# Patient Record
Sex: Female | Born: 1955 | Race: White | Hispanic: No | Smoking: Former smoker
Health system: Southern US, Community
[De-identification: ages and names within clinical notes are randomized; demographics above are authoritative.]

## PROBLEM LIST (undated history)

## (undated) DIAGNOSIS — I70203 Unspecified atherosclerosis of native arteries of extremities, bilateral legs: Secondary | ICD-10-CM

## (undated) DIAGNOSIS — I714 Abdominal aortic aneurysm, without rupture, unspecified: Secondary | ICD-10-CM

## (undated) DIAGNOSIS — E785 Hyperlipidemia, unspecified: Secondary | ICD-10-CM

## (undated) DIAGNOSIS — M79606 Pain in leg, unspecified: Secondary | ICD-10-CM

## (undated) DIAGNOSIS — G4762 Sleep related leg cramps: Secondary | ICD-10-CM

## (undated) HISTORY — DX: Unspecified atherosclerosis of native arteries of extremities, bilateral legs: I70.203

## (undated) HISTORY — DX: Pain in leg, unspecified: M79.606

## (undated) HISTORY — DX: Sleep related leg cramps: G47.62

## (undated) HISTORY — PX: TONSILLECTOMY: SUR1361

## (undated) HISTORY — DX: Hyperlipidemia, unspecified: E78.5

---

## 1984-09-29 HISTORY — PX: TUBAL LIGATION: SHX77

## 2000-04-26 ENCOUNTER — Encounter: Payer: Self-pay | Admitting: Emergency Medicine

## 2000-04-26 ENCOUNTER — Emergency Department (HOSPITAL_COMMUNITY): Admission: EM | Admit: 2000-04-26 | Discharge: 2000-04-26 | Payer: Self-pay | Admitting: Emergency Medicine

## 2003-10-28 ENCOUNTER — Emergency Department (HOSPITAL_COMMUNITY): Admission: EM | Admit: 2003-10-28 | Discharge: 2003-10-28 | Payer: Self-pay | Admitting: Emergency Medicine

## 2003-11-08 ENCOUNTER — Encounter: Admission: RE | Admit: 2003-11-08 | Discharge: 2004-01-01 | Payer: Self-pay | Admitting: Family Medicine

## 2004-01-04 ENCOUNTER — Encounter: Admission: RE | Admit: 2004-01-04 | Discharge: 2004-01-04 | Payer: Self-pay | Admitting: Family Medicine

## 2005-11-28 IMAGING — NM NM BONE 3 PHASE
10 series · 10 of 10 positions shown · non-contrast
Comparison: none

CLINICAL DATA: Right foot pain, especially second metatarsal.
 NUCLEAR MEDICINE THREE PHASE BONE SCAN ? 01/04/04 
 Following intravenous injection of 26.1 mCi of 5echnetium-FFm MDP, triple phase bone scan of the bilateral feet and ankles is compared with [REDACTED] right foot radiographs of 01/01/04.  On all three phases, no abnormality is seen, specifically at the second metatarsal.
 IMPRESSION
 Negative.

[bf bone flow · 2.36mm/px · 1 of 1 slices shown (1 of 5)]
[im 1/1]
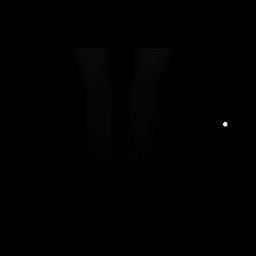

[bf bone flow · 2.33mm/px · 1 of 1 slices shown (2 of 5)]
[im 1/1]
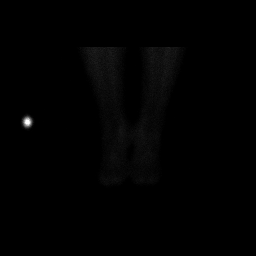

[st statics, dual detec · 2.36mm/px · 1 of 1 slices shown (1 of 5)]
[im 1/1]
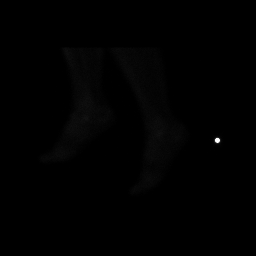

[st statics, dual detec · 2.33mm/px · 1 of 1 slices shown (2 of 5)]
[im 1/1]
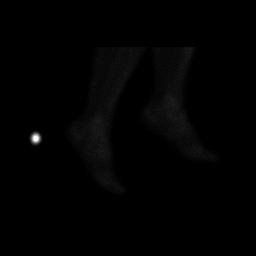

[st statics, dual detec · 2.36mm/px · 1 of 1 slices shown (3 of 5)]
[im 1/1]
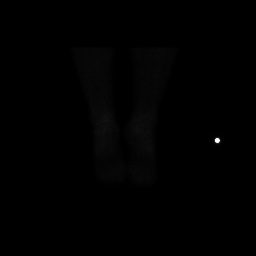

[st statics, dual detec · 2.33mm/px · 1 of 1 slices shown (4 of 5)]
[im 1/1]
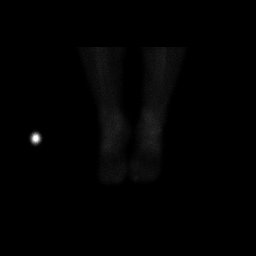

[bf bone flow · 2.33mm/px · 1 of 1 slices shown (3 of 5)]
[im 1/1]
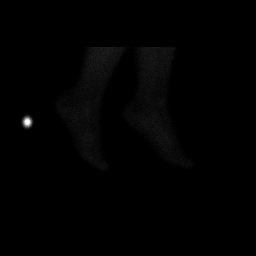

[bf bone flow · 2.36mm/px · 1 of 1 slices shown (4 of 5)]
[im 1/1]
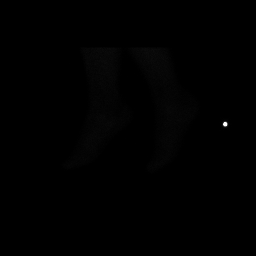

[bf bone flow · 2.36mm/px · 1 of 1 slices shown (5 of 5)]
[im 1/1]
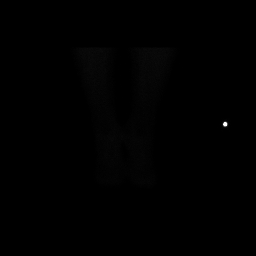

[st statics, dual detec · 2.36mm/px · 1 of 1 slices shown (5 of 5)]
[im 1/1]
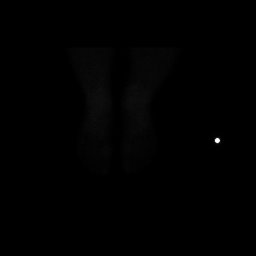

[10 of 10 positions shown; findings below may reference images not displayed]

## 2012-01-16 ENCOUNTER — Other Ambulatory Visit (HOSPITAL_COMMUNITY)
Admission: RE | Admit: 2012-01-16 | Discharge: 2012-01-16 | Disposition: A | Discharge status: Home / Self Care | Payer: BC Managed Care – PPO | Source: Ambulatory Visit | Attending: Family Medicine | Admitting: Family Medicine

## 2012-01-16 DIAGNOSIS — Z1159 Encounter for screening for other viral diseases: Secondary | ICD-10-CM | POA: Insufficient documentation

## 2012-01-16 DIAGNOSIS — Z0000 Encounter for general adult medical examination without abnormal findings: Secondary | ICD-10-CM | POA: Insufficient documentation

## 2012-02-17 ENCOUNTER — Other Ambulatory Visit: Payer: Self-pay | Admitting: Cardiology

## 2012-02-19 NOTE — H&P (Signed)
Patient: Jane Miller, Jane Miller Provider: Dade Rodin, MD  DOB: 09/14/1956 Age: 55 Y Sex: Female Date: 02/17/2012  Phone: [phone]   Address: [address], [place], [place]-[address]  Pcp: WILLIAM R HARRIS, IV        Subjective:     CC:    1. MS/stress test f/u.        HPI:  General:  55-year-old with long-standing tobacco history, hyperlipidemia with recent chest discomfort. She's been having this over the past 2 months and this has been happening with more frequency almost daily currently. 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 No nausea or diaphoresis. Stress was situations can also bring them on. Occasionally the chest pain can last for hours in duration. Her LDL is 159, HDL is 29. She also has occasional burning in her legs when walking. At times, she feels as though a grocery cart is hitting her Achilles tendon. She underwent a nuclear stress test which did show a subtle change in the anterior wall, ischemia cannot be excluded however breast attenuation was present. After lengthy discussion today with her daughter as well and since she continues to have her chest discomfort almost on a daily basis, we will proceed with heart catheterization..        ROS:  The other elements of the review of systems are negative.       Family History: Father: deceased 79 yrs gangrene in toe, then MRSA Mother: deceased 69 yrs heart attack at 60 and 69 Paternal Grand Father: deceased Paternal Grand Mother: deceased breast cancer Maternal Grand Father: deceased Maternal Grand Mother: deceased Sister 1: alive obesity, GERD 1 sister(s) .        Social History:  General:  History of smoking  cigarettes: Current smoker Frequency: 1 PPD Estimated Pack-years: 39 Smoking: 1 PPD.  Alcohol: Rare.  Marital Status: Divorced.  Children: 2, 2 grandkids.  Religion: Baptist.  Seat belt use: yes.        Medications: Aspirin  81 MG Tablet Chewable 1 tablet Once a day, Medication List reviewed and reconciled with the patient       Allergies: N.K.D.A.      Objective:     Vitals: Wt 170, Wt change -2 lb, Ht 59, BMI 34.33, Pulse sitting 72, BP sitting 122/66.       Examination:  Cardiology, General:  GENERAL APPEARANCE: pleasant, NAD.  HEENT: unremarkable.  CAROTID UPSTROKE: normal, no bruit.  JVD: flat.  HEART SOUNDS: regular, normal S1, S2, no S3 or S4.  MURMUR: absent.  LUNGS: no rales or wheezes.  ABDOMEN: soft, non tender, positive bowel sounds, no masses felt.  EXTREMITIES: no leg edema.  PERIPHERAL PULSES: diminished, 2+ radial.        Assessment:     Assessment:  1. 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 Claudication - 443.9    Plan:     1. Abnormal cardiovascular stress test  LAB: Basic Metabolic    GLUCOSE 82  70-99 - mg/dL    BUN 9  6-26 - mg/dL    CREATININE 0.81  0.60-1.30 - mg/dl    eGFR (NON-AFRICAN AMERICAN) 73 >60 - calc    eGFR (AFRICAN AMERICAN) 88 >60 - calc    SODIUM 142  136-145 - mmol/L    POTASSIUM 4.2  3.5-5.5 - mmol/L    CHLORIDE 108  98-107 - mmol/L H     C02 30  22-32 - mg/dL    ANION GAP 8.1 6.0-20.0 - mmol/L    CALCIUM 9.2  8.6-10.3 - mg/dL     Antoinette Borgwardt 02/18/2012 05:06:09 PM > okay for catheterization    LAB: CBC with Diff    WBC 8.4 4.0-11.0 - K/ul    RBC 4.57 4.20-5.40 - M/uL    HGB 14.0 12.0-16.0 - g/dL    HCT 42.0 37.0-47.0 - %    MCH 30.6 27.0-33.0 - pg    MPV 8.1 7.5-10.7 - fL    MCV 91.9 81.0-99.0 - fL    MCHC 33.3 32.0-36.0 - g/dL    RDW 13.3 11.5-15.5 - %    NRBC# 0.00 -    PLT 245 150-400 - K/uL    NEUT % 69.2 43.3-71.9 - %    NRBC% 0.00 - %    LYMPH% 18.5 16.8-43.5 - %    MONO % 8.7 4.6-12.4 - %    EOS % 2.5 0.0-7.8 - %    BASO % 1.1 0.0-1.0 - % H   NEUT # 5.8 1.9-7.2 - K/uL    LYMPH# 1.50 1.10-2.70 - K/uL    MONO # 0.7 0.3-0.8 - K/uL    EOS # 0.2 0.0-0.6 - K/uL    BASO  # 0.1 0.0-0.1 - K/uL     Breyonna Nault 02/18/2012 05:06:09 PM > okay for catheterization    LAB: PT (Prothrombin Time) (005199)    Prothrombin Time 10.9 9.1-12.0 - SEC    INR 1.0 0.8-1.2 -     Keiaira Donlan 02/18/2012 05:06:09 PM > okay for catheterization    Given her abnormality on the nuclear stress test as well as ongoing symptoms, tobacco use, age, hyperlipidemia we will proceed with cardiac catheterization. Radial artery approach. Risks and benefits of cardiac catheterization have been reviewed including risk of stroke, heart attack, death, bleeding, renal impariment and arterial damage. There was ample oppurtuny to answer questions. Alternatives were discussed. Patient understands and wishes to proceed. If symptoms worsen or become more worrisome, she is to seek medical attention. Continue with aspirin.       2. Combined hyperlipidemia Start Atorvastatin Calcium Tablet, 20 MG, 1 tablet, Orally, Once a day, 30 day(s), 30, Refills 12 .  LAB: Statin Panel (Ordered for 05/19/2012) I would like to see her LDL less than 100. I will start atorvastatin.She states that she has trouble with some medications with nausea. We will monitor.       3. Claudication  I will proceed with workup after cardiac catheterization. This will include ABIs.        Immunizations:        Labs:        Procedure Codes: 80048 ECL BMP, 85025 ECL CBC PLATELET DIFF, 36415 BLOOD COLLECTION ROUTINE VENIPUNCTURE       Preventive:         Follow Up: post cath      Provider: Hosanna Betley, MD  Patient: Jane Miller, Jane Miller DOB: 04/18/1956 Date: 02/17/2012    

## 2012-02-20 ENCOUNTER — Ambulatory Visit (HOSPITAL_COMMUNITY)
Admission: RE | Admit: 2012-02-20 | Discharge: 2012-02-20 | Disposition: A | Discharge status: Home / Self Care | Payer: BC Managed Care – PPO | Source: Ambulatory Visit | Attending: Cardiology | Admitting: Cardiology

## 2012-02-20 ENCOUNTER — Encounter (HOSPITAL_COMMUNITY)
Admission: RE | Disposition: A | Discharge status: Home / Self Care | Payer: Self-pay | Source: Ambulatory Visit | Attending: Cardiology

## 2012-02-20 DIAGNOSIS — F17200 Nicotine dependence, unspecified, uncomplicated: Secondary | ICD-10-CM | POA: Insufficient documentation

## 2012-02-20 DIAGNOSIS — I739 Peripheral vascular disease, unspecified: Secondary | ICD-10-CM | POA: Insufficient documentation

## 2012-02-20 DIAGNOSIS — R0989 Other specified symptoms and signs involving the circulatory and respiratory systems: Secondary | ICD-10-CM | POA: Insufficient documentation

## 2012-02-20 DIAGNOSIS — Q159 Congenital malformation of eye, unspecified: Secondary | ICD-10-CM | POA: Insufficient documentation

## 2012-02-20 DIAGNOSIS — R0789 Other chest pain: Secondary | ICD-10-CM | POA: Insufficient documentation

## 2012-02-20 DIAGNOSIS — E785 Hyperlipidemia, unspecified: Secondary | ICD-10-CM | POA: Insufficient documentation

## 2012-02-20 DIAGNOSIS — R0609 Other forms of dyspnea: Secondary | ICD-10-CM | POA: Insufficient documentation

## 2012-02-20 HISTORY — PX: LEFT HEART CATHETERIZATION WITH CORONARY ANGIOGRAM: SHX5451

## 2012-02-20 SURGERY — LEFT HEART CATHETERIZATION WITH CORONARY ANGIOGRAM
Anesthesia: LOCAL

## 2012-02-20 MED ORDER — ASPIRIN 81 MG PO CHEW
CHEWABLE_TABLET | ORAL | Status: AC
  Administered 2012-02-20: 324 mg
  Filled 2012-02-20: qty 4

## 2012-02-20 MED ORDER — SODIUM CHLORIDE 0.9 % IV SOLN: 250.0000 mL | INTRAVENOUS | Status: DC | PRN

## 2012-02-20 MED ORDER — DIAZEPAM 5 MG PO TABS
ORAL_TABLET | ORAL | Status: AC
  Filled 2012-02-20: qty 1

## 2012-02-20 MED ORDER — MIDAZOLAM HCL 2 MG/2ML IJ SOLN
INTRAMUSCULAR | Status: AC
  Filled 2012-02-20: qty 2

## 2012-02-20 MED ORDER — SODIUM CHLORIDE 0.9 % IV SOLN: INTRAVENOUS | Status: DC

## 2012-02-20 MED ORDER — HEPARIN (PORCINE) IN NACL 2-0.9 UNIT/ML-% IJ SOLN
INTRAMUSCULAR | Status: AC
  Filled 2012-02-20: qty 2000

## 2012-02-20 MED ORDER — LIDOCAINE HCL (PF) 1 % IJ SOLN
INTRAMUSCULAR | Status: AC
  Filled 2012-02-20: qty 30

## 2012-02-20 MED ORDER — SODIUM CHLORIDE 0.9 % IJ SOLN: 3.0000 mL | Freq: Two times a day (BID) | INTRAMUSCULAR | Status: DC

## 2012-02-20 MED ORDER — ASPIRIN 81 MG PO CHEW: 324.0000 mg | CHEWABLE_TABLET | ORAL | Status: DC

## 2012-02-20 MED ORDER — SODIUM CHLORIDE 0.9 % IJ SOLN: 3.0000 mL | INTRAMUSCULAR | Status: DC | PRN

## 2012-02-20 MED ORDER — FENTANYL CITRATE 0.05 MG/ML IJ SOLN
INTRAMUSCULAR | Status: AC
  Filled 2012-02-20: qty 2

## 2012-02-20 MED ORDER — NITROGLYCERIN 0.2 MG/ML ON CALL CATH LAB
INTRAVENOUS | Status: AC
  Filled 2012-02-20: qty 1

## 2012-02-20 MED ORDER — DIAZEPAM 5 MG PO TABS: 5.0000 mg | ORAL_TABLET | ORAL | Status: DC

## 2012-02-20 NOTE — CV Procedure (Signed)
PROCEDURE:  Left heart catheterization with selective coronary angiography, left ventriculogram via the radial artery approach.  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  The risks, benefits, and details of the procedure were explained to the patient, including possibilities of stroke, heart attack, death, renal impairment, arterial damage, bleeding.  The patient verbalized understanding and wanted to proceed.  Informed written consent was obtained.  PROCEDURE TECHNIQUE:  Allen's test was performed pre-and post procedure and was normal. The right radial artery site was prepped and draped in a sterile fashion. One percent lidocaine was used for local anesthesia. Using the modified Seldinger technique a 5 French hydrophilic sheath was inserted into the radial artery without difficulty. 3 mg of verapamil was administered via the sheath. A Judkins right #4 catheter with the guidance of a Versicore wire was placed in the right coronary cusp and selectively cannulated the right coronary artery. After traversing the aortic arch, 3800 units of heparin IV was administered. A Judkins left #3.5 catheter was used to selectively cannulate the left main artery. Multiple views with hand injection of Omnipaque were obtained. Catheter a pigtail catheter was used to cross into the left ventricle, hemodynamics were obtained, and a left ventriculogram was performed in the RAO position with power injection. Following the procedure, sheath was removed, patient was hemodynamically stable, hemostasis was maintained with a Terumo T band.   CONTRAST:  Total of 50 ml.    FLOUROSCOPY TIME: 1.9 min.  COMPLICATIONS:  None.    HEMODYNAMICS:  Aortic pressure was 120/85mmHg; LV systolic pressure was ; LVEDP .  There was no gradient between the left  ventricle and aorta.    ANGIOGRAPHIC DATA:    Left main: No angiographically significant CAD, branches into LAD and circumflex  Left anterior descending (LAD): No angiographically significant CAD  Circumflex artery (CIRC): No angiographically significant CAD  Right coronary artery (RCA): No angiographically significant CAD  LEFT VENTRICULOGRAM:  Left ventricular angiogram was done in the 30 RAO projection and revealed normal left ventricular wall motion and systolic function with an estimated ejection fraction of 60%. Descending aorta and thoracic aorta appear normal  IMPRESSIONS:  No angiographically significant CAD Normal left ventricular systolic function.  LVEDP 16 mmHg.  Ejection fraction 60%.  RECOMMENDATION:  Chest discomfort noncardiac, likely musculoskeletal do to physical nature of her job. Tobacco cessation discussed. I will have her come back in one week for post catheterization check.

## 2012-02-20 NOTE — H&P (View-Only) (Signed)
Patient: Jane Miller, Jane Miller Provider: Donato Schultz, MD  DOB: 1956/04/21 Age: 56 Y Sex: Female Date: 02/17/2012  Phone: [phone]   Address: [address], [place], [place]  Pcp: Tawni Pummel HARRIS, IV        Subjective:     CC:    1. MS/stress test f/u.        HPI:  General:  56 year old with long-standing tobacco history, hyperlipidemia with recent chest discomfort. She's been having this over the past 2 months and this has been happening with more frequency almost daily currently. She works at the [employer] in [employer] and she notices left-sided chest pain/arm pain when exerting herself or lifting. No nausea or diaphoresis. Stress was situations can also bring them on. Occasionally the chest pain can last for hours in duration. Her LDL is 159, HDL is 29. She also has occasional burning in her legs when walking. At times, she feels as though a grocery cart is hitting her Achilles tendon. She underwent a nuclear stress test which did show a subtle change in the anterior wall, ischemia cannot be excluded however breast attenuation was present. After lengthy discussion today with her daughter as well and since she continues to have her chest discomfort almost on a daily basis, we will proceed with heart catheterization..        ROS:  The other elements of the review of systems are negative.       Family History: Father: deceased 51 yrs gangrene in toe, then MRSA Mother: deceased 95 yrs heart attack at 81 and 47 Paternal Grand Father: deceased Paternal Grand Mother: deceased breast cancer Maternal Grand Father: deceased Maternal Grand Mother: deceased Sister 1: alive obesity, GERD 1 sister(s) .        Social History:  General:  History of smoking  cigarettes: Current smoker Frequency: 1 PPD Estimated Pack-years: 39 Smoking: 1 PPD.  Alcohol: Rare.  Marital Status: Divorced.  Children: 2, 2 grandkids.  Religion: Baptist.  Seat belt use: yes.        Medications: Aspirin  81 MG Tablet Chewable 1 tablet Once a day, Medication List reviewed and reconciled with the patient       Allergies: N.K.D.A.      Objective:     Vitals: Wt 170, Wt change -2 lb, Ht 59, BMI 34.33, Pulse sitting 72, BP sitting 122/66.       Examination:  Cardiology, General:  GENERAL APPEARANCE: pleasant, NAD.  HEENT: unremarkable.  CAROTID UPSTROKE: normal, no bruit.  JVD: flat.  HEART SOUNDS: regular, normal S1, S2, no S3 or S4.  MURMUR: absent.  LUNGS: no rales or wheezes.  ABDOMEN: soft, non tender, positive bowel sounds, no masses felt.  EXTREMITIES: no leg edema.  PERIPHERAL PULSES: diminished, 2+ radial.        Assessment:     Assessment:  1. 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 Claudication - 443.9    Plan:     1. Abnormal cardiovascular stress test  LAB: Basic Metabolic    GLUCOSE 82  70-99 - mg/dL    BUN 9  6-29 - mg/dL    CREATININE 5.28  4.13-2.44 - mg/dl    eGFR (NON-AFRICAN AMERICAN) 73 >60 - calc    eGFR (AFRICAN AMERICAN) 88 >60 - calc    SODIUM 142  136-145 - mmol/L    POTASSIUM 4.2  3.5-5.5 - mmol/L    CHLORIDE 108  98-107 - mmol/L H  C02 30  22-32 - mg/dL    ANION GAP 8.1 1.6-10.9 - mmol/L    CALCIUM 9.2  8.6-10.3 - mg/dL     [facility] 60/45/4098 05:06:09 PM > okay for catheterization    LAB: CBC with Diff    WBC 8.4 4.0-11.0 - K/ul    RBC 4.57 4.20-5.40 - M/uL    HGB 14.0 12.0-16.0 - g/dL    HCT 11.9 14.7-82.9 - %    MCH 30.6 27.0-33.0 - pg    MPV 8.1 7.5-10.7 - fL    MCV 91.9 81.0-99.0 - fL    MCHC 33.3 32.0-36.0 - g/dL    RDW 56.2 13.0-86.5 - %    NRBC# 0.00 -    PLT 245 150-400 - K/uL    NEUT % 69.2 43.3-71.9 - %    NRBC% 0.00 - %    LYMPH% 18.5 16.8-43.5 - %    MONO % 8.7 4.6-12.4 - %    EOS % 2.5 0.0-7.8 - %    BASO % 1.1 0.0-1.0 - % H   NEUT # 5.8 1.9-7.2 - K/uL    LYMPH# 1.50 1.10-2.70 - K/uL    MONO # 0.7 0.3-0.8 - K/uL    EOS # 0.2 0.0-0.6 - K/uL    BASO  # 0.1 0.0-0.1 - K/uL     Torben Soloway 02/18/2012 05:06:09 PM > okay for catheterization    LAB: PT (Prothrombin Time) (784696)    Prothrombin Time 10.9 9.1-12.0 - SEC    INR 1.0 0.8-1.2 -     Kennedie Pardoe 02/18/2012 05:06:09 PM > okay for catheterization    Given her abnormality on the nuclear stress test as well as ongoing symptoms, tobacco use, age, hyperlipidemia we will proceed with cardiac catheterization. Radial artery approach. Risks and benefits of cardiac catheterization have been reviewed including risk of stroke, heart attack, death, bleeding, renal impariment and arterial damage. There was ample oppurtuny to answer questions. Alternatives were discussed. Patient understands and wishes to proceed. If symptoms worsen or become more worrisome, she is to seek medical attention. Continue with aspirin.       2. Combined hyperlipidemia Start Atorvastatin Calcium Tablet, 20 MG, 1 tablet, Orally, Once a day, 30 day(s), 30, Refills 12 .  LAB: Statin Panel (Ordered for 05/19/2012) I would like to see her LDL less than 100. I will start atorvastatin.She states that she has trouble with some medications with nausea. We will monitor.       3. Claudication  I will proceed with workup after cardiac catheterization. This will include ABIs.        Immunizations:        Labs:        Procedure Codes: 29528 ECL BMP, 85025 ECL CBC PLATELET DIFF, 41324 BLOOD COLLECTION ROUTINE VENIPUNCTURE       Preventive:         Follow Up: post cath      Provider: Donato Schultz, MD  Patient: Jane, Miller DOB: 03/30/1956 Date: 02/17/2012

## 2012-02-20 NOTE — Discharge Instructions (Signed)
Radial Site Care Refer to this sheet in the next few weeks. These instructions provide you with information on caring for yourself after your procedure. Your caregiver may also give you more specific instructions. Your treatment has been planned according to current medical practices, but problems sometimes occur. Call your caregiver if you have any problems or questions after your procedure. HOME CARE INSTRUCTIONS  You may shower the day after the procedure.Remove the bandage (dressing) and gently wash the site with plain soap and water.Gently pat the site dry.   Do not apply powder or lotion to the site.   Do not submerge the affected site in water for 3 to 5 days.   Inspect the site at least twice daily.   Do not flex or bend the affected arm for 24 hours.   No lifting over 5 pounds (2.3 kg) for 5 days after your procedure.   Do not drive home if you are discharged the same day of the procedure. Have someone else drive you.   You may drive 24 hours after the procedure unless otherwise instructed by your caregiver.   Do not operate machinery or power tools for 24 hours.   A responsible adult should be with you for the first 24 hours after you arrive home.  What to expect:  Any bruising will usually fade within 1 to 2 weeks.   Blood that collects in the tissue (hematoma) may be painful to the touch. It should usually decrease in size and tenderness within 1 to 2 weeks.  SEEK IMMEDIATE MEDICAL CARE IF:  You have unusual pain at the radial site.   You have redness, warmth, swelling, or pain at the radial site.   You have drainage (other than a small amount of blood on the dressing).   You have chills.   You have a fever or persistent symptoms for more than 72 hours.   You have a fever and your symptoms suddenly get worse.   Your arm becomes pale, cool, tingly, or numb.   You have heavy bleeding from the site. Hold pressure on the site.  Document Released: 10/18/2010  Document Revised: 09/04/2011 Document Reviewed: 10/18/2010 [employer] Patient Information 2012 [employer]. 

## 2012-02-20 NOTE — Interval H&P Note (Signed)
History and Physical Interval Note:  02/20/2012 12:36 PM  Jane Miller  has presented today for surgery, with the diagnosis of abnormal stress test  The various methods of treatment have been discussed with the patient and family. After consideration of risks, benefits and other options for treatment, the patient has consented to  Procedure(s) (LRB): LEFT HEART CATHETERIZATION WITH CORONARY ANGIOGRAM (N/A) as a surgical intervention .  The patients' history has been reviewed, patient examined, no change in status, stable for surgery.  I have reviewed the patients' chart and labs.  Questions were answered to the patient's satisfaction.     Cozy Veale

## 2012-02-23 MED FILL — Nicardipine HCl IV Soln 2.5 MG/ML: INTRAVENOUS | Qty: 1 | Status: AC

## 2013-02-23 ENCOUNTER — Ambulatory Visit (INDEPENDENT_AMBULATORY_CARE_PROVIDER_SITE_OTHER): Payer: BC Managed Care – PPO | Admitting: Family Medicine

## 2013-02-23 VITALS — BP 128/72 | HR 77 | Temp 97.9°F | Resp 18 | Wt 165.0 lb

## 2013-02-23 DIAGNOSIS — Z0000 Encounter for general adult medical examination without abnormal findings: Secondary | ICD-10-CM

## 2013-02-23 DIAGNOSIS — F17210 Nicotine dependence, cigarettes, uncomplicated: Secondary | ICD-10-CM

## 2013-02-23 LAB — POCT CBC
HCT, POC: 43 % (ref 37.7–47.9)
Hemoglobin: 13.8 g/dL (ref 12.2–16.2)
Lymph, poc: 2.3 (ref 0.6–3.4)
MCH, POC: 30.5 pg (ref 27–31.2)
MCHC: 32.1 g/dL (ref 31.8–35.4)
MCV: 94.9 fL (ref 80–97)
MPV: 8.4 fL (ref 0–99.8)
RBC: 4.53 M/uL (ref 4.04–5.48)
WBC: 10.3 10*3/uL — AB (ref 4.6–10.2)

## 2013-02-23 LAB — COMPLETE METABOLIC PANEL WITH GFR
ALT: 14 U/L (ref 0–35)
BUN: 16 mg/dL (ref 6–23)
CO2: 30 mEq/L (ref 19–32)
Calcium: 9.5 mg/dL (ref 8.4–10.5)
Chloride: 102 mEq/L (ref 96–112)
Creat: 0.82 mg/dL (ref 0.50–1.10)
GFR, Est African American: >89 mL/min
GFR, Est Non African American: 80 mL/min
Glucose, Bld: 93 mg/dL (ref 70–99)
Total Bilirubin: 0.3 mg/dL (ref 0.3–1.2)

## 2013-02-23 NOTE — Patient Instructions (Addendum)
Thank you for coming in today. We will get some labs today.  For health screening I recommend a colonoscopy and mammogram.  You can schedule both of those yourself.  Please return in 1 year or sooner.  Please please quit smoking.   You can schedule your colonoscopy at your convenience by calling: [facility] [address] [place], [place] [address] [phone]  You can schedule you mammogram at your convenience by calling: [facility]  [address] [place], [place] [address] [phone]

## 2013-02-23 NOTE — Progress Notes (Signed)
Jane Miller is a 57 y.o. female who presents to [facility] today for wellness visit. She was seen in her primary care office at [facility] physicians about one year ago for a wellness visit. He had been multiple years since her last visit an office at that time. They elected for a stress test due to her smoking status and mild hyperlipidemia. She had a nondiagnostic stress test and as result a cardiac catheterization that was essentially normal. In the interval year she has done well with no complaints. She denies any chest pains palpitations or shortness of breath. She has had a normal Pap smear one year ago at her OB/GYN office. However she has not had mammograms in 20 years nor colonoscopy ever. She denies any lumps bumps fevers chills weight loss like her stool or blood in her stool. She feels well otherwise. She notes that she was intolerant of the statin he started and is currently not taking any medications. She smokes about one half pack a day.    PMH: Reviewed as noted above History  Substance Use Topics  . Smoking status: Current Every Day Smoker  . Smokeless tobacco: Not on file  . Alcohol Use: No   ROS as above  Medications reviewed. Current Outpatient Prescriptions  Medication Sig Dispense Refill  . aspirin 81 MG chewable tablet Chew 81 mg by mouth daily.       No current facility-administered medications for this visit.    Exam:  BP 128/72  Pulse 77  Temp(Src) 97.9 F (36.6 C) (Oral)  Resp 18  Wt 165 lb (74.844 kg)  BMI 33.31 kg/m2 Gen: Well NAD HEENT: EOMI,  MMM Lungs: CTABL Nl WOB Heart: RRR no MRG Abd: NABS, NT, ND Exts: Non edematous BL  LE, warm and well perfused.   Results for orders placed in visit on 02/23/13 (from the past 72 hour(s))  POCT CBC     Status: Abnormal   Collection Time    02/23/13  5:45 PM      Result Value Range   WBC 10.3 (*) 4.6 - 10.2 K/uL   Lymph, poc 2.3  0.6 - 3.4   POC LYMPH PERCENT 22.4  10 - 50 %L   MID (cbc) 0.8  0 - 0.9   POC MID  % 7.6  0 - 12 %M   POC Granulocyte 7.2 (*) 2 - 6.9   Granulocyte percent 70.0  37 - 80 %G   RBC 4.53  4.04 - 5.48 M/uL   Hemoglobin 13.8  12.2 - 16.2 g/dL   HCT, POC 16.1  09.6 - 47.9 %   MCV 94.9  80 - 97 fL   MCH, POC 30.5  27 - 31.2 pg   MCHC 32.1  31.8 - 35.4 g/dL   RDW, POC 04.5     Platelet Count, POC 248  142 - 424 K/uL   MPV 8.4  0 - 99.8 fL    Assessment and Plan: 57 y.o. female with  Wellness visit 1) smoking status: Discussed smoking cessation. Patient is willing to cut back but not quite at this time. Recommend aggressive smoking cessation if possible. 2) colon cancer screening: Recommended colonoscopy. Patient will "think about it". 3) breast cancer screening: Strongly recommended for mammogram. Patient says she'll call and schedule a mammogram soon. 4) cervical cancer screening: Recent normal Pap smear recommend screening in 3 years from most recent negative Pap smear. 5) obesity: Recommended weight loss 6) Labs: CMP, CBC, Direct LCL 7) F/u 1  year or sooner prn

## 2013-02-24 ENCOUNTER — Encounter: Payer: Self-pay | Admitting: Family Medicine

## 2014-09-07 ENCOUNTER — Encounter (HOSPITAL_COMMUNITY): Payer: Self-pay | Admitting: Cardiology

## 2022-11-26 ENCOUNTER — Ambulatory Visit: Payer: 59 | Admitting: Internal Medicine

## 2022-11-26 ENCOUNTER — Encounter: Payer: Self-pay | Admitting: Internal Medicine

## 2022-11-26 VITALS — BP 128/78 | HR 70 | Temp 98.0°F | Resp 18 | Ht 58.5 in | Wt 147.5 lb

## 2022-11-26 DIAGNOSIS — Z8701 Personal history of pneumonia (recurrent): Secondary | ICD-10-CM | POA: Insufficient documentation

## 2022-11-26 DIAGNOSIS — Z720 Tobacco use: Secondary | ICD-10-CM | POA: Insufficient documentation

## 2022-11-26 DIAGNOSIS — M79604 Pain in right leg: Secondary | ICD-10-CM | POA: Diagnosis not present

## 2022-11-26 DIAGNOSIS — M79605 Pain in left leg: Secondary | ICD-10-CM | POA: Diagnosis not present

## 2022-11-26 DIAGNOSIS — G4762 Sleep related leg cramps: Secondary | ICD-10-CM | POA: Diagnosis not present

## 2022-11-26 DIAGNOSIS — Z6830 Body mass index (BMI) 30.0-30.9, adult: Secondary | ICD-10-CM

## 2022-11-26 NOTE — Assessment & Plan Note (Signed)
I will do ABI in our office

## 2022-11-26 NOTE — Progress Notes (Signed)
New Patient Office Visit  Subjective    Patient ID: Jane Miller, female    DOB: 11/11/1955  Age: 67 y.o. MRN: [id number]  CC:  Chief Complaint  Patient presents with   New Patient (Initial Visit)    New Patient     HPI Jane Miller presents to establish care with [place]. She is c/o leg cramp at night almost every night. Also she has leg pain every time she walk in her both legs. She smoke 1 PPD.  He denies any exertional dyspnea or chest pain.  She had stress test done 1415 years ago that was normal. She was seen at [facility] November 21 with COVID-pneumonia and acute hypoxic respiratory failure and per record she was advised to have repeat chest x-ray for resolution of left base infiltrate.  Patient does not even remember that she had the xray done.  She also does not remember that they have told her to have repeat chest x-ray.   Outpatient Encounter Medications as of 11/26/2022  Medication Sig   [DISCONTINUED] aspirin 81 MG chewable tablet Chew 81 mg by mouth daily.   No facility-administered encounter medications on file as of 11/26/2022.    No past medical history on file.  Past Surgical History:  Procedure Laterality Date   CESAREAN SECTION     LEFT HEART CATHETERIZATION WITH CORONARY ANGIOGRAM N/A 02/20/2012   Procedure: LEFT HEART CATHETERIZATION WITH CORONARY ANGIOGRAM;  Surgeon: Candee Furbish, MD;  Location: [facility] CATH LAB;  Service: Cardiovascular;  Laterality: N/A;    Family History  Problem Relation Age of Onset   COPD Mother     Social History   Socioeconomic History   Marital status: Divorced    Spouse name: Not on file   Number of children: Not on file   Years of education: Not on file   Highest education level: Not on file  Occupational History   Not on file  Tobacco Use   Smoking status: Every Day   Smokeless tobacco: Not on file  Vaping Use   Vaping Use: Never used  Substance and Sexual Activity   Alcohol use: No   Drug use: No    Sexual activity: Not Currently  Other Topics Concern   Not on file  Social History Narrative   Not on file   Social Determinants of Health   Financial Resource Strain: Not on file  Food Insecurity: Not on file  Transportation Needs: Not on file  Physical Activity: Not on file  Stress: Not on file  Social Connections: Not on file  Intimate Partner Violence: Not on file    Review of Systems  Constitutional: Negative.   Respiratory: Negative.    Cardiovascular: Negative.   Musculoskeletal:        Leg cramps and pain in both legs while walking.        Objective    BP 128/78 (BP Location: Right Arm, Patient Position: Sitting, Cuff Size: Normal)   Pulse 70   Temp 98 F (36.7 C)   Resp 18   Ht 4' 10.5" (1.486 m)   Wt 147 lb 8 oz (66.9 kg)   SpO2 93%   BMI 30.30 kg/m   Physical Exam Constitutional:      Appearance: Normal appearance. She is obese.  Cardiovascular:     Rate and Rhythm: Normal rate and regular rhythm.     Heart sounds: Normal heart sounds.  Pulmonary:     Effort: Pulmonary effort is normal.  Breath sounds: Normal breath sounds.  Abdominal:     General: Bowel sounds are normal.     Palpations: Abdomen is soft.  Neurological:     General: No focal deficit present.     Mental Status: She is alert and oriented to person, place, and time.         Assessment & Plan:   Problem List Items Addressed This Visit       Other   Nocturnal leg cramps - Primary    I will start gabapentin 300 mg at night      Leg pain, bilateral    I will do ABI in our office      Relevant Orders   CBC with Differential/Platelet (Completed)   CMP14 + Anion Gap (Completed)   [place] Lower Ext Art Bilat   Lipid panel   Tobacco abuse    She still smoke      Relevant Orders   [place] Lower Ext Art Bilat   Lipid panel   History of pneumonia    Will repeat chest x-ray.      Relevant Orders   DG Chest 2 View    No follow-ups on file.   Garwin Brothers, MD

## 2022-11-26 NOTE — Assessment & Plan Note (Signed)
I will start gabapentin 300 mg at night

## 2022-11-26 NOTE — Assessment & Plan Note (Signed)
She still smoke

## 2022-11-26 NOTE — Addendum Note (Signed)
Addended byGarwin Brothers on: 11/26/2022 04:33 PM   Modules accepted: Orders

## 2022-11-27 ENCOUNTER — Encounter: Payer: Self-pay | Admitting: Internal Medicine

## 2022-11-27 LAB — LIPID PANEL
Chol/HDL Ratio: 3.9 ratio (ref 0.0–4.4)
Cholesterol, Total: 124 mg/dL (ref 100–199)
HDL: 32 mg/dL — ABNORMAL LOW (ref >39)
LDL Chol Calc (NIH): 80 mg/dL (ref 0–99)
Triglycerides: 56 mg/dL (ref 0–149)
VLDL Cholesterol Cal: 12 mg/dL (ref 5–40)

## 2022-11-27 LAB — CBC WITH DIFFERENTIAL/PLATELET
Basophils Absolute: 0.1 10*3/uL (ref 0.0–0.2)
Basos: 1 %
EOS (ABSOLUTE): 0.1 10*3/uL (ref 0.0–0.4)
Eos: 2 %
Hematocrit: 38.4 % (ref 34.0–46.6)
Hemoglobin: 13.2 g/dL (ref 11.1–15.9)
Immature Grans (Abs): 0 10*3/uL (ref 0.0–0.1)
Immature Granulocytes: 0 %
Lymphocytes Absolute: 1.4 10*3/uL (ref 0.7–3.1)
Lymphs: 22 %
MCH: 32.7 pg (ref 26.6–33.0)
MCHC: 34.4 g/dL (ref 31.5–35.7)
MCV: 95 fL (ref 79–97)
Monocytes Absolute: 0.5 10*3/uL (ref 0.1–0.9)
Monocytes: 7 %
Neutrophils Absolute: 4.3 10*3/uL (ref 1.4–7.0)
Neutrophils: 68 %
Platelets: 247 10*3/uL (ref 150–450)
RBC: 4.04 x10E6/uL (ref 3.77–5.28)
RDW: 13.3 % (ref 11.7–15.4)
WBC: 6.4 10*3/uL (ref 3.4–10.8)

## 2022-11-27 LAB — CMP14 + ANION GAP
ALT: 7 IU/L (ref 0–32)
AST: 12 IU/L (ref 0–40)
Albumin/Globulin Ratio: 1.5 (ref 1.2–2.2)
Albumin: 3.8 g/dL — ABNORMAL LOW (ref 3.9–4.9)
Alkaline Phosphatase: 116 IU/L (ref 44–121)
Anion Gap: 12 mmol/L (ref 10.0–18.0)
BUN/Creatinine Ratio: 13 (ref 12–28)
BUN: 14 mg/dL (ref 8–27)
Bilirubin Total: 0.3 mg/dL (ref 0.0–1.2)
CO2: 30 mmol/L — ABNORMAL HIGH (ref 20–29)
Calcium: 8.5 mg/dL — ABNORMAL LOW (ref 8.7–10.3)
Chloride: 101 mmol/L (ref 96–106)
Creatinine, Ser: 1.07 mg/dL — ABNORMAL HIGH (ref 0.57–1.00)
Globulin, Total: 2.5 g/dL (ref 1.5–4.5)
Glucose: 107 mg/dL — ABNORMAL HIGH (ref 70–99)
Potassium: 5.4 mmol/L — ABNORMAL HIGH (ref 3.5–5.2)
Sodium: 143 mmol/L (ref 134–144)
Total Protein: 6.3 g/dL (ref 6.0–8.5)
eGFR: 57 mL/min/{1.73_m2} — ABNORMAL LOW (ref >59)

## 2022-11-27 NOTE — Assessment & Plan Note (Signed)
Will repeat chest x-ray.

## 2022-12-03 ENCOUNTER — Ambulatory Visit: Payer: 59 | Admitting: Internal Medicine

## 2022-12-18 ENCOUNTER — Encounter: Payer: Self-pay | Admitting: Internal Medicine

## 2022-12-18 ENCOUNTER — Ambulatory Visit: Payer: 59 | Admitting: Internal Medicine

## 2022-12-18 VITALS — BP 130/80 | HR 96 | Temp 97.0°F | Resp 18 | Ht 58.5 in | Wt 151.1 lb

## 2022-12-18 DIAGNOSIS — M79605 Pain in left leg: Secondary | ICD-10-CM

## 2022-12-18 DIAGNOSIS — M79604 Pain in right leg: Secondary | ICD-10-CM

## 2022-12-18 DIAGNOSIS — Z131 Encounter for screening for diabetes mellitus: Secondary | ICD-10-CM

## 2022-12-18 DIAGNOSIS — N183 Chronic kidney disease, stage 3 unspecified: Secondary | ICD-10-CM | POA: Insufficient documentation

## 2022-12-18 DIAGNOSIS — N1831 Chronic kidney disease, stage 3a: Secondary | ICD-10-CM

## 2022-12-18 DIAGNOSIS — Z8701 Personal history of pneumonia (recurrent): Secondary | ICD-10-CM | POA: Diagnosis not present

## 2022-12-18 NOTE — Assessment & Plan Note (Signed)
Will do cxray for follow up of pneumonia.

## 2022-12-18 NOTE — Assessment & Plan Note (Signed)
Need to monitor.

## 2022-12-18 NOTE — Progress Notes (Addendum)
   Office Visit  Subjective   Patient ID: Jane Miller   DOB: 02-Nov-1955   Age: 67 y.o.   MRN: [id number]   Chief Complaint Chief Complaint  Patient presents with   Follow-up    Need vascular referral      History of Present Illness Jane Miller presents tor follow up. She says that she does not have any further leg cramp but she says that when she walk up to 100 feet, her legs become very heavy and hurt. She has to rest before she walk further. I have rewieved her labs with her including lipid panel. She quit smoking in Feb 24.  He denies any exertional dyspnea or chest pain.  She had stress test done 1415 years ago that was normal. She was seen at [facility] November 21 with COVID-pneumonia and acute hypoxic respiratory failure and per record she was advised to have repeat chest x-ray for resolution of left base infiltrate.    Her gfr was 57 and will repeat it on next visit. Past Medical History No past medical history on file.   Allergies Allergies  Allergen Reactions   Prednisone Nausea And Vomiting     Review of Systems Review of Systems  Constitutional: Negative.   HENT: Negative.    Respiratory: Negative.    Cardiovascular: Negative.   Gastrointestinal: Negative.   Neurological: Negative.        Objective:    Vitals BP 130/80 (BP Location: Left Arm, Patient Position: Sitting, Cuff Size: Normal)   Pulse 96   Temp (!) 97 F (36.1 C)   Resp 18   Ht 4' 10.5" (1.486 m)   Wt 151 lb 2 oz (68.5 kg)   SpO2 96%   BMI 31.05 kg/m    Physical Examination Physical Exam Constitutional:      Appearance: Normal appearance.  Cardiovascular:     Rate and Rhythm: Normal rate and regular rhythm.     Heart sounds: Normal heart sounds.  Pulmonary:     Effort: Pulmonary effort is normal.     Breath sounds: Normal breath sounds.  Neurological:     Mental Status: She is alert.        Assessment & Plan:   Leg pain, bilateral Will do ABI for leg  pain in her leg when walking  History of pneumonia Will do cxray for follow up of pneumonia.  CKD (chronic kidney disease) stage 3, GFR 30-59 ml/min (HCC) Need to monitor.    Return in about 3 months (around 03/20/2023).   Garwin Brothers, MD

## 2022-12-18 NOTE — Assessment & Plan Note (Signed)
Will do ABI for leg pain in her leg when walking

## 2022-12-19 ENCOUNTER — Ambulatory Visit (HOSPITAL_BASED_OUTPATIENT_CLINIC_OR_DEPARTMENT_OTHER)
Admission: RE | Admit: 2022-12-19 | Discharge: 2022-12-19 | Disposition: A | Discharge status: Home / Self Care | Payer: 59 | Source: Ambulatory Visit | Attending: Internal Medicine | Admitting: Internal Medicine

## 2022-12-19 DIAGNOSIS — Z8701 Personal history of pneumonia (recurrent): Secondary | ICD-10-CM | POA: Diagnosis present

## 2023-01-06 ENCOUNTER — Other Ambulatory Visit: Payer: Self-pay | Admitting: *Deleted

## 2023-01-06 DIAGNOSIS — M79604 Pain in right leg: Secondary | ICD-10-CM

## 2023-01-06 DIAGNOSIS — G4762 Sleep related leg cramps: Secondary | ICD-10-CM

## 2023-01-13 ENCOUNTER — Ambulatory Visit (HOSPITAL_COMMUNITY)
Admission: RE | Admit: 2023-01-13 | Discharge: 2023-01-13 | Disposition: A | Discharge status: Home / Self Care | Payer: 59 | Source: Ambulatory Visit | Attending: Vascular Surgery | Admitting: Vascular Surgery

## 2023-01-13 DIAGNOSIS — M79605 Pain in left leg: Secondary | ICD-10-CM | POA: Insufficient documentation

## 2023-01-13 DIAGNOSIS — M79604 Pain in right leg: Secondary | ICD-10-CM | POA: Insufficient documentation

## 2023-01-13 DIAGNOSIS — G4762 Sleep related leg cramps: Secondary | ICD-10-CM | POA: Insufficient documentation

## 2023-01-13 LAB — VAS US ABI WITH/WO TBI
Left ABI: 0.55
Right ABI: 0.32

## 2023-01-14 ENCOUNTER — Ambulatory Visit (INDEPENDENT_AMBULATORY_CARE_PROVIDER_SITE_OTHER): Payer: 59 | Admitting: Vascular Surgery

## 2023-01-14 ENCOUNTER — Encounter: Payer: Self-pay | Admitting: Vascular Surgery

## 2023-01-14 VITALS — BP 144/70 | HR 78 | Temp 98.2°F | Resp 20 | Ht <= 58 in | Wt 145.0 lb

## 2023-01-14 DIAGNOSIS — I70213 Atherosclerosis of native arteries of extremities with intermittent claudication, bilateral legs: Secondary | ICD-10-CM

## 2023-01-14 NOTE — Progress Notes (Signed)
Patient ID: Jane Miller, female   DOB: 1956/02/22, 67 y.o.   MRN: [id number]  Reason for Consult: New Patient (Initial Visit)   Referred by Eloisa Northern, MD  Subjective:     HPI:  Jane Miller is a 67 y.o. female without significant personal or family history of vascular disease.  She did have cardiac catheterization over 10 years ago.  Her father had peripheral vascular disease and had stenting of one of his legs she does not remember the exact reason why.  She recently quit smoking on February 29 after a long history of smoking.  She more recently has developed pain in her legs particularly with walking but states that she can walk up to 10 minutes at a time and she is able to make it through [employer] when she is doing her grocery shopping although she may have to stop once or twice.  She denies any tissue loss or ulceration.  No previous lower extremity interventions.  She denies stroke, TIA or amaurosis no personal or family history of aneurysm disease.  History reviewed. No pertinent past medical history. Family History  Problem Relation Age of Onset   COPD Mother    Past Surgical History:  Procedure Laterality Date   CESAREAN SECTION     LEFT HEART CATHETERIZATION WITH CORONARY ANGIOGRAM N/A 02/20/2012   Procedure: LEFT HEART CATHETERIZATION WITH CORONARY ANGIOGRAM;  Surgeon: Donato Schultz, MD;  Location: [facility] CATH LAB;  Service: Cardiovascular;  Laterality: N/A;    Short Social History:  Social History   Tobacco Use   Smoking status: Former    Types: Cigarettes    Quit date: 11/27/2022    Years since quitting: 0.1   Smokeless tobacco: Not on file  Substance Use Topics   Alcohol use: No    Allergies  Allergen Reactions   Prednisone Nausea And Vomiting    No current outpatient medications on file.   No current facility-administered medications for this visit.    Review of Systems  Constitutional:  Constitutional negative. HENT: HENT negative.  Eyes:  Eyes negative.  Cardiovascular: Positive for claudication.  GI: Gastrointestinal negative.  Musculoskeletal: Musculoskeletal negative.  Skin: Skin negative.  Neurological: Neurological negative. Hematologic: Positive for bruises/bleeds easily.  Psychiatric: Psychiatric negative.        Objective:  Objective   Vitals:   01/14/23 0850  BP: (!) 144/70  Pulse: 78  Resp: 20  Temp: 98.2 F (36.8 C)  SpO2: 90%     Physical Exam HENT:     Head: Normocephalic.     Nose: Nose normal.     Mouth/Throat:     Mouth: Mucous membranes are moist.  Eyes:     Pupils: Pupils are equal, round, and reactive to light.  Neck:     Vascular: No carotid bruit.  Cardiovascular:     Pulses:          Femoral pulses are 0 on the right side and 0 on the left side.      Popliteal pulses are 0 on the right side and 0 on the left side.     Heart sounds: Murmur heard.  Pulmonary:     Effort: Pulmonary effort is normal.  Abdominal:     General: Abdomen is flat.  Musculoskeletal:     Cervical back: Normal range of motion and neck supple.  Skin:    Capillary Refill: Capillary refill takes 2 to 3 seconds.  Neurological:     General: No focal deficit  present.     Mental Status: She is alert.  Psychiatric:        Mood and Affect: Mood normal.        Behavior: Behavior normal.        Thought Content: Thought content normal.        Judgment: Judgment normal.     Data: ABI Findings:  +---------+------------------+-----+----------+--------+  Right   Rt Pressure (mmHg)IndexWaveform  Comment   +---------+------------------+-----+----------+--------+  Brachial 150                                        +---------+------------------+-----+----------+--------+  PTA     48                0.32 monophasic          +---------+------------------+-----+----------+--------+  DP      47                0.31 monophasic          +---------+------------------+-----+----------+--------+   Great Toe0                 0.00 Absent              +---------+------------------+-----+----------+--------+   +---------+------------------+-----+----------+-------+  Left    Lt Pressure (mmHg)IndexWaveform  Comment  +---------+------------------+-----+----------+-------+  Brachial 123                                       +---------+------------------+-----+----------+-------+  PTA     83                0.55 monophasic         +---------+------------------+-----+----------+-------+  DP      53                0.35 monophasic         +---------+------------------+-----+----------+-------+  Great Toe25                0.17 Abnormal           +---------+------------------+-----+----------+-------+   +-------+-----------+-----------+------------+------------+  ABI/TBIToday's ABIToday's TBIPrevious ABIPrevious TBI  +-------+-----------+-----------+------------+------------+  Right 0.32       0                                    +-------+-----------+-----------+------------+------------+  Left  0.55       0.17                                 +-------+-----------+-----------+------------+------------+        Arterial wall calcification precludes accurate ankle pressures and ABIs.    Summary:  Right: Resting right ankle-brachial index indicates severe right lower  extremity arterial disease. The right toe-brachial index is abnormal.   Left: Resting left ankle-brachial index indicates moderate left lower  extremity arterial disease. The left toe-brachial index is abnormal. PPG  tracings appear dampened.      Assessment/Plan:    67 year old female with short distance life-limiting claudication with severely depressed ABIs and toe pressures bilaterally but without rest pain or tissue loss.  I discussed with the patient her options moving forward which include continued walking program particularly given that she recently quit smoking  on February 29 versus proceeding with CT angio given that she has no palpable femoral pulses.  She has elected to proceed with CT scan for further evaluation I do suspect she has aortoiliac occlusive disease on top of possibly multilevel lower extremity vascular disease as well.  We discussed continued walking with a dedicated walking program and taking baby aspirin daily and diligent protection of her feet given her depressed ABIs.  I will see her after the CT scan.     Maeola Harman MD [facility]

## 2023-02-03 ENCOUNTER — Other Ambulatory Visit: Payer: Self-pay

## 2023-02-03 DIAGNOSIS — I70213 Atherosclerosis of native arteries of extremities with intermittent claudication, bilateral legs: Secondary | ICD-10-CM

## 2023-02-20 ENCOUNTER — Ambulatory Visit (HOSPITAL_COMMUNITY)
Admission: RE | Admit: 2023-02-20 | Discharge: 2023-02-20 | Disposition: A | Discharge status: Home / Self Care | Payer: 59 | Source: Ambulatory Visit | Attending: Vascular Surgery | Admitting: Vascular Surgery

## 2023-02-20 DIAGNOSIS — I7409 Other arterial embolism and thrombosis of abdominal aorta: Secondary | ICD-10-CM | POA: Diagnosis not present

## 2023-02-20 DIAGNOSIS — I70213 Atherosclerosis of native arteries of extremities with intermittent claudication, bilateral legs: Secondary | ICD-10-CM | POA: Insufficient documentation

## 2023-02-20 MED ORDER — IOHEXOL 350 MG/ML SOLN
75.0000 mL | Freq: Once | INTRAVENOUS | Status: AC | PRN
  Administered 2023-02-20: 75 mL via INTRAVENOUS

## 2023-02-25 ENCOUNTER — Ambulatory Visit (INDEPENDENT_AMBULATORY_CARE_PROVIDER_SITE_OTHER): Payer: 59 | Admitting: Vascular Surgery

## 2023-02-25 ENCOUNTER — Encounter: Payer: Self-pay | Admitting: Vascular Surgery

## 2023-02-25 VITALS — BP 148/75 | HR 70 | Temp 98.0°F | Resp 20 | Ht <= 58 in | Wt 150.0 lb

## 2023-02-25 DIAGNOSIS — I70213 Atherosclerosis of native arteries of extremities with intermittent claudication, bilateral legs: Secondary | ICD-10-CM | POA: Diagnosis not present

## 2023-02-25 NOTE — Progress Notes (Signed)
Patient ID: Jane Miller, female   DOB: 10/29/1955, 67 y.o.   MRN: [id number]  Reason for Consult: Follow-up   Referred by Eloisa Northern, MD  Subjective:     HPI:  Jane Miller is a 67 y.o. female without history of vascular disease but was a long-term smoker and had a father with peripheral vascular disease.  She continues to be free of any smoking since February 29 which was 3 months ago today.  She does have short distance claudication occasionally can walk up to 10 minutes but more recently this has been much shorter particularly on the right leg relative to the left.  She does not have any tissue loss or ulceration.  Recent underwent CT scan and is here today to follow-up on the findings.  History reviewed. No pertinent past medical history. Family History  Problem Relation Age of Onset   COPD Mother    Past Surgical History:  Procedure Laterality Date   CESAREAN SECTION     LEFT HEART CATHETERIZATION WITH CORONARY ANGIOGRAM N/A 02/20/2012   Procedure: LEFT HEART CATHETERIZATION WITH CORONARY ANGIOGRAM;  Surgeon: Donato Schultz, MD;  Location: [facility] CATH LAB;  Service: Cardiovascular;  Laterality: N/A;    Short Social History:  Social History   Tobacco Use   Smoking status: Former    Types: Cigarettes    Quit date: 11/27/2022    Years since quitting: 0.2   Smokeless tobacco: Not on file  Substance Use Topics   Alcohol use: No    Allergies  Allergen Reactions   Prednisone Nausea And Vomiting    No current outpatient medications on file.   No current facility-administered medications for this visit.    Review of Systems  Constitutional:  Constitutional negative. HENT: HENT negative.  Eyes: Eyes negative.  Cardiovascular: Positive for claudication.  GI: Positive for abdominal pain.  Musculoskeletal: Musculoskeletal negative.  Skin: Skin negative.  Neurological: Neurological negative. Hematologic: Hematologic/lymphatic negative.  Psychiatric: Psychiatric  negative.        Objective:  Objective   Vitals:   02/25/23 1548  BP: (!) 148/75  Pulse: 70  Resp: 20  Temp: 98 F (36.7 C)  SpO2: 96%  Weight: 150 lb (68 kg)  Height: 4\' 10"  (1.473 m)   Body mass index is 31.35 kg/m.  Physical Exam Eyes:     Pupils: Pupils are equal, round, and reactive to light.  Cardiovascular:     Pulses:          Femoral pulses are 0 on the right side and 0 on the left side.    Heart sounds: Murmur heard.  Pulmonary:     Effort: Pulmonary effort is normal.  Abdominal:     General: Abdomen is flat.  Musculoskeletal:     Right lower leg: No edema.     Left lower leg: No edema.  Skin:    General: Skin is warm.     Capillary Refill: Capillary refill takes less than 2 seconds.  Neurological:     General: No focal deficit present.     Mental Status: She is alert.  Psychiatric:        Mood and Affect: Mood normal.        Thought Content: Thought content normal.        Judgment: Judgment normal.     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 Associated fusiform infrarenal aortic ectasia, estimated 2.8 cm maximum dimension,  with circumferential thrombus/plaque of the ectatic segment.   Reconstitution of the bilateral iliac system at the bifurcation of CIA/EIA with minimal external iliac artery disease and common femoral artery disease bilaterally.   No significant femoropopliteal or tibial artery disease.   Cirrhotic configuration of the liver. There are 2 low-density lesions which most likely represent biliary cysts, and 1 additional lesion within segment 3 at the falciform ligament which cannot be completely characterized as a cystic lesion on this CT. Referral to hepatology/gastroenterology may be useful to initiate care/surveillance imaging, potentially MRI evaluation.     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  She has short distance claudication on the right greater than left with an absent toe pressure on the right and severely depressed ABIs on the right with moderate on the left.  Given the presence of aortic ectasia along with distal aortic occlusion I have recommended aortobifemoral bypass.  I discussed her other options being stenting of the distal aorta and common iliac arteries but this would be difficult and potentially not achievable given the long segment occlusive disease.  She will need cardiac clearance.  I discussed her many risk given her underlying medical disease and new finding of cirrhotic configuration of her liver although her most recent labs including CMP were mostly within normal limits and her liver function appears maintained.  She demonstrates good understanding of the need for 7 to 10-day hospitalization with likely rehab upon hospital discharge and the 6 to 8-week recovery from the procedure.  All of her questions were answered today in the presence of her daughter.  Will get her scheduled for aortobifemoral bypass after cardiac clearance.     Maeola Harman MD [facility]

## 2023-02-26 ENCOUNTER — Telehealth: Payer: Self-pay | Admitting: *Deleted

## 2023-02-26 NOTE — Telephone Encounter (Signed)
   Pre-operative Risk Assessment    Patient Name: Jane Miller  DOB: 01/08/1956 MRN: [id number]    PT IS GOING TO NEED A NEW PT APPT. WILL SEND A MESSAGE TO SCHEDULING TO PLEASE ASSIST IN SETTING UP NEW PT APPT WITH MD. MESSAGE SENT TO CHART PREP AS WELL.   Request for Surgical Clearance    Procedure:   AORTO BIFEMORAL BYPASS  Date of Surgery:  Clearance TBD                                 Surgeon:  DR. Lemar Livings Surgeon's Group or Practice Name:  [facility] Phone number:  [phone] Fax number:  [phone]   Type of Clearance Requested:   - Medical ; NO MEDICATIONS IN THE PT'S CHART AT THIS TIME; PT IS GOING TO BE A NEW PT WITH [facility]   Type of Anesthesia:  General    Additional requests/questions:    Elpidio Anis   02/26/2023, 2:49 PM

## 2023-03-02 NOTE — Telephone Encounter (Signed)
Patient is scheduled to see Dr. Eldridge Dace on 03/03/23 as a new patient for preop clearance

## 2023-03-03 ENCOUNTER — Ambulatory Visit: Payer: 59 | Attending: Interventional Cardiology | Admitting: Interventional Cardiology

## 2023-03-03 ENCOUNTER — Encounter: Payer: Self-pay | Admitting: *Deleted

## 2023-03-03 ENCOUNTER — Encounter: Payer: Self-pay | Admitting: Interventional Cardiology

## 2023-03-03 VITALS — BP 152/60 | HR 72 | Ht 59.0 in | Wt 148.6 lb

## 2023-03-03 DIAGNOSIS — E785 Hyperlipidemia, unspecified: Secondary | ICD-10-CM

## 2023-03-03 DIAGNOSIS — I739 Peripheral vascular disease, unspecified: Secondary | ICD-10-CM | POA: Diagnosis not present

## 2023-03-03 DIAGNOSIS — R0609 Other forms of dyspnea: Secondary | ICD-10-CM | POA: Diagnosis not present

## 2023-03-03 DIAGNOSIS — Z01818 Encounter for other preprocedural examination: Secondary | ICD-10-CM

## 2023-03-03 MED ORDER — ROSUVASTATIN CALCIUM 10 MG PO TABS: 10.0000 mg | ORAL_TABLET | Freq: Every day | ORAL | 3 refills | Status: DC

## 2023-03-03 NOTE — Progress Notes (Signed)
Cardiology Office Note   Date:  03/03/2023   ID:  Kiaralee, Valdiviezo 1956-08-19, MRN [id number]  PCP:  Eloisa Northern, MD    No chief complaint on file.  Preoperative evaluation  Wt Readings from Last 3 Encounters:  02/25/23 150 lb (68 kg)  01/14/23 145 lb (65.8 kg)  12/18/22 151 lb 2 oz (68.5 kg)       History of Present Illness: Jane Miller is a 67 y.o. female who is being seen today for the evaluation of preoperative cardiovascular examination at the request of Eloisa Northern, MD.   Scheduled to have : "Procedure:   AORTO BIFEMORAL BYPASS" with Dr. Randie Heinz.   She saw Dr. Anne Fu in the past.   Cath in 01/2012: "Left main: No angiographically significant CAD, branches into LAD and circumflex   Left anterior descending (LAD): No angiographically significant CAD   Circumflex artery (CIRC): No angiographically significant CAD   Right coronary artery (RCA): No angiographically significant CAD   LEFT VENTRICULOGRAM:  Left ventricular angiogram was done in the 30 RAO projection and revealed normal left ventricular wall motion and systolic function with an estimated ejection fraction of 60%. Descending aorta and thoracic aorta appear normal   IMPRESSIONS:   No angiographically significant CAD Normal left ventricular systolic function.  LVEDP 16 mmHg.  Ejection fraction 60%.   RECOMMENDATION:  Chest discomfort noncardiac, likely musculoskeletal do to physical nature of her job. Tobacco cessation discussed. "   She has not been walking regularly for the past few years.    Statin prescribed in 2013 but she had nausea.  Stopped smoking 3 months ago. No cravings.  Stopped cold [place].  Lives in a split-level house so has to walk up some stairs.  She can feel some shortness of breath with walking upstairs.  Denies : Chest pain. Dizziness. Leg edema. Nitroglycerin use. Orthopnea. Palpitations. Paroxysmal nocturnal dyspnea. Shortness of breath. Syncope.    Past Medical  History:  Diagnosis Date   Atherosclerosis of native artery of both lower extremities (HCC)    Leg pain    Nocturnal leg cramps     Past Surgical History:  Procedure Laterality Date   CESAREAN SECTION     LEFT HEART CATHETERIZATION WITH CORONARY ANGIOGRAM N/A 02/20/2012   Procedure: LEFT HEART CATHETERIZATION WITH CORONARY ANGIOGRAM;  Surgeon: Donato Schultz, MD;  Location: [facility] CATH LAB;  Service: Cardiovascular;  Laterality: N/A;     No current outpatient medications on file.   No current facility-administered medications for this visit.    Allergies:   Prednisone    Social History:  The patient  reports that she quit smoking about 3 months ago. Her smoking use included cigarettes. She does not have any smokeless tobacco history on file. She reports that she does not drink alcohol and does not use drugs.   Family History:  The patient's family history includes COPD in her mother.    ROS:  Please see the history of present illness.   Otherwise, review of systems are positive for DOE.   All other systems are reviewed and negative.    PHYSICAL EXAM: VS:  There were no vitals taken for this visit. , BMI There is no height or weight on file to calculate BMI. GEN: Well nourished, well developed, in no acute distress HEENT: normal Neck: no JVD, carotid bruits, or masses Cardiac: RRR; no murmurs, rubs, or gallops,no edema  Respiratory:  clear to auscultation bilaterally, normal work of breathing GI: soft, nontender,  nondistended, + BS MS: no deformity or atrophy, 2+ right radial pulse Skin: warm and dry, no rash Neuro:  Strength and sensation are intact Psych: euthymic mood, full affect   EKG:   The ekg ordered today demonstrates    Recent Labs: 11/26/2022: ALT 7; BUN 14; Creatinine, Ser 1.07; Hemoglobin 13.2; Platelets 247; Potassium 5.4; Sodium 143   Lipid Panel    Component Value Date/Time   CHOL 124 11/26/2022 0417   TRIG 56 11/26/2022 0417   HDL 32 (L) 11/26/2022 0417    CHOLHDL 3.9 11/26/2022 0417   LDLCALC 80 11/26/2022 0417   LDLDIRECT 167 (H) 02/23/2013 1745     Other studies Reviewed: Additional studies/ records that were reviewed today with results demonstrating: NSR, no ST changes.   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  Planing for aorto-bifem.  I recommended starting a statin.  , plant-based diet Would recommend whole food , high fiber diet .avoid processed foods. Rosuvastatin 10 mg daily discussed and prescribed.  Hopefully, she will not have nausea. Dyspnea on exertion: Likely some deconditioning as her PAD has limited her activity.  Need to rule out ischemia.  Plan for nuclear stress test to evaluate for ischemia as well as get ejection fraction.  Of note, she did have a false positive nuclear study many years ago.  We would be looking for high risk findings  Former smoker: has quit smoking about 3 months ago. Elevated BP reading: Home readings are in the 120s/70s typically.  Only elevated in the doctor's office.  Continue to monitor blood pressure at home. Liver changes noted on her CT.  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    Current medicines are reviewed at length with the patient today.  The patient concerns regarding her medicines were addressed.  The following changes have been made:  No change  Labs/ tests ordered today include: Lexiscan Cardiolite, liver/lipid tests in 3 months after starting statin No orders of the defined types were placed in this encounter.   Recommend 150 minutes/week of aerobic exercise Low fat, low carb, high fiber diet recommended  Disposition:   FU based on stress test results   Signed, Lance Muss, MD  03/03/2023 8:39 AM    [facility] [address] [place], [place], [place]  [address] Phone: [phone]; Fax: [phone]

## 2023-03-03 NOTE — Patient Instructions (Addendum)
Medication Instructions:  Your physician has recommended you make the following change in your medication: Start Rosuvastatin 10 mg by mouth daily   *If you need a refill on your cardiac medications before your next appointment, please call your pharmacy*   Lab Work: Your physician recommends that you return for lab work in: 3 months.  Lipid and liver profiles.  This will be fasting  If you have labs (blood work) drawn today and your tests are completely normal, you will receive your results only by: MyChart Message (if you have MyChart) OR A paper copy in the mail If you have any lab test that is abnormal or we need to change your treatment, we will call you to review the results.   Testing/Procedures: Your physician has requested that you have a lexiscan myoview. For further information please visit [email or web address] Please follow instruction sheet, as given.    Follow-Up: At [facility], you and your health needs are our priority.  As part of our continuing mission to provide you with exceptional heart care, we have created designated Provider Care Teams.  These Care Teams include your primary Cardiologist (physician) and Advanced Practice Providers (APPs -  Physician Assistants and Nurse Practitioners) who all work together to provide you with the care you need, when you need it.  We recommend signing up for the patient portal called "MyChart".  Sign up information is provided on this After Visit Summary.  MyChart is used to connect with patients for Virtual Visits (Telemedicine).  Patients are able to view lab/test results, encounter notes, upcoming appointments, etc.  Non-urgent messages can be sent to your provider as well.   To learn more about what you can do with MyChart, go to [email or web address].    Your next appointment:   Based on test results  Provider:   Lance Muss, MD     Other Instructions

## 2023-03-03 NOTE — Telephone Encounter (Signed)
Getting stress test to risk stratify

## 2023-03-09 ENCOUNTER — Telehealth (HOSPITAL_COMMUNITY): Payer: Self-pay | Admitting: *Deleted

## 2023-03-09 NOTE — Telephone Encounter (Signed)
See notes from Dr. Eldridge Dace. Pt needs stress test

## 2023-03-09 NOTE — Telephone Encounter (Signed)
Patient given detailed instructions per Myocardial Perfusion Study Information Sheet for the test on 03/11/23 Patient notified to arrive 15 minutes early and that it is imperative to arrive on time for appointment to keep from having the test rescheduled.  If you need to cancel or reschedule your appointment, please call the office within 24 hours of your appointment. . Patient verbalized understanding. Ricky Ala

## 2023-03-11 ENCOUNTER — Ambulatory Visit (HOSPITAL_COMMUNITY): Payer: 59 | Attending: Interventional Cardiology

## 2023-03-11 DIAGNOSIS — R0609 Other forms of dyspnea: Secondary | ICD-10-CM | POA: Diagnosis not present

## 2023-03-11 DIAGNOSIS — I739 Peripheral vascular disease, unspecified: Secondary | ICD-10-CM

## 2023-03-11 DIAGNOSIS — Z01818 Encounter for other preprocedural examination: Secondary | ICD-10-CM | POA: Insufficient documentation

## 2023-03-11 LAB — MYOCARDIAL PERFUSION IMAGING
Base ST Depression (mm): 0 mm
LV dias vol: 56 mL (ref 46–106)
LV sys vol: 22 mL
Nuc Stress EF: 61 %
Peak HR: 88 {beats}/min
Rest HR: 57 {beats}/min
Rest Nuclear Isotope Dose: 10.9 mCi
SDS: 3
SRS: 2
SSS: 6
ST Depression (mm): 0 mm
Stress Nuclear Isotope Dose: 31.1 mCi
TID: 1.04

## 2023-03-11 MED ORDER — REGADENOSON 0.4 MG/5ML IV SOLN
0.4000 mg | Freq: Once | INTRAVENOUS | Status: AC
Start: 2023-03-11 — End: 2023-03-11
  Administered 2023-03-11: 0.4 mg via INTRAVENOUS

## 2023-03-11 MED ORDER — TECHNETIUM TC 99M TETROFOSMIN IV KIT
10.9000 | PACK | Freq: Once | INTRAVENOUS | Status: AC | PRN
  Administered 2023-03-11: 10.9 via INTRAVENOUS

## 2023-03-11 MED ORDER — TECHNETIUM TC 99M TETROFOSMIN IV KIT
31.1000 | PACK | Freq: Once | INTRAVENOUS | Status: AC | PRN
  Administered 2023-03-11: 31.1 via INTRAVENOUS

## 2023-03-17 ENCOUNTER — Other Ambulatory Visit: Payer: Self-pay | Admitting: *Deleted

## 2023-03-17 MED ORDER — ROSUVASTATIN CALCIUM 10 MG PO TABS: 10.0000 mg | ORAL_TABLET | Freq: Every day | ORAL | 3 refills | Status: DC

## 2023-03-19 ENCOUNTER — Telehealth: Payer: Self-pay | Admitting: Interventional Cardiology

## 2023-03-19 ENCOUNTER — Telehealth: Payer: Self-pay

## 2023-03-19 NOTE — Telephone Encounter (Signed)
Message left for pre op team regarding pt's cardiac clearance after having stress test last week.

## 2023-03-19 NOTE — Telephone Encounter (Signed)
Office calling to follow up on clearance. Please advise. 

## 2023-03-19 NOTE — Telephone Encounter (Signed)
Will forward to pre op APP to review if the pt has been cleared. Looks like pt needed stress test before being cleared per Dr. Eldridge Dace.

## 2023-03-19 NOTE — Telephone Encounter (Signed)
     Primary Cardiologist: Lance Muss, MD  Chart reviewed as part of pre-operative protocol coverage. Given past medical history and time since last visit, based on ACC/AHA guidelines, Jane Miller would be at acceptable risk for the planned procedure without further cardiovascular testing.   Her RCRI is a class I risk, 0.4% risk of major cardiac event.  I will route this recommendation to the requesting party via Epic fax function and remove from pre-op pool.  Please call with questions.  Thomasene Ripple. Tzvi Economou NP-C     03/19/2023, 1:04 PM [facility] [address] Office [phone] Fax [phone]

## 2023-03-20 ENCOUNTER — Telehealth: Payer: Self-pay

## 2023-03-20 NOTE — Telephone Encounter (Signed)
Attempted to reach pt to schedule surgery. LVM asking her to call us back.

## 2023-03-23 ENCOUNTER — Other Ambulatory Visit: Payer: Self-pay

## 2023-03-23 DIAGNOSIS — I70213 Atherosclerosis of native arteries of extremities with intermittent claudication, bilateral legs: Secondary | ICD-10-CM

## 2023-03-23 NOTE — Telephone Encounter (Signed)
Patient returned call. Surgery scheduled for 04/21/23. Instructions provided and she voiced understanding.

## 2023-03-27 ENCOUNTER — Ambulatory Visit: Payer: 59 | Admitting: Internal Medicine

## 2023-04-10 ENCOUNTER — Encounter (HOSPITAL_COMMUNITY): Payer: Self-pay

## 2023-04-10 NOTE — Pre-Procedure Instructions (Addendum)
Surgical Instructions   Your procedure is scheduled on April 21, 2023. Report to [facility] Main Entrance "A" at 5:30 A.M., then check in with the Admitting office. Any questions or running late day of surgery: call [phone]  Questions prior to your surgery date: call [phone], Monday-Friday, 8am-4pm. If you experience any cold or flu symptoms such as cough, fever, chills, shortness of breath, etc. between now and your scheduled surgery, please notify us at the above number.     Remember:  Do not eat or drink after midnight the night before your surgery     Take these medicines the morning of surgery with A SIP OF WATER:  aspirin EC   rosuvastatin (CRESTOR)    One week prior to surgery, STOP taking any Aspirin (unless otherwise instructed by your surgeon) Aleve, Naproxen, Ibuprofen, Motrin, Advil, Goody's, BC's, all herbal medications, fish oil, and non-prescription vitamins.                     Do NOT Smoke (Tobacco/Vaping) for 24 hours prior to your procedure.  If you use a CPAP at night, you may bring your mask/headgear for your overnight stay.   You will be asked to remove any contacts, glasses, piercing's, hearing aid's, dentures/partials prior to surgery. Please bring cases for these items if needed.    Patients discharged the day of surgery will not be allowed to drive home, and someone needs to stay with them for 24 hours.  SURGICAL WAITING ROOM VISITATION Patients may have no more than 2 support people in the waiting area - these visitors may rotate.   Pre-op nurse will coordinate an appropriate time for 1 ADULT support person, who may not rotate, to accompany patient in pre-op.  Children under the age of 60 must have an adult with them who is not the patient and must remain in the main waiting area with an adult.  If the patient needs to stay at the hospital during part of their recovery, the visitor guidelines for inpatient rooms apply.  Please refer to the  [facility] website for the visitor guidelines for any additional information.   If you received a COVID test during your pre-op visit  it is requested that you wear a mask when out in public, stay away from anyone that may not be feeling well and notify your surgeon if you develop symptoms. If you have been in contact with anyone that has tested positive in the last 10 days please notify you surgeon.      Pre-operative CHG Bathing Instructions   You can play a key role in reducing the risk of infection after surgery. Your skin needs to be as free of germs as possible. You can reduce the number of germs on your skin by washing with CHG (chlorhexidine gluconate) soap before surgery. CHG is an antiseptic soap that kills germs and continues to kill germs even after washing.   DO NOT use if you have an allergy to chlorhexidine/CHG or antibacterial soaps. If your skin becomes reddened or irritated, stop using the CHG and notify one of our RNs at [phone].                         TAKE A SHOWER THE NIGHT BEFORE SURGERY AND THE DAY OF SURGERY    Please keep in mind the following:  DO NOT shave, including legs and underarms, 48 hours prior to surgery.   You may shave  your face before/day of surgery.  Place clean sheets on your bed the night before surgery Use a clean washcloth (not used since being washed) for each shower. DO NOT sleep with pet's night before surgery.  CHG Shower Instructions:  If you choose to wash your hair and private area, wash first with your normal shampoo/soap.  After you use shampoo/soap, rinse your hair and body thoroughly to remove shampoo/soap residue.  Turn the water OFF and apply half the bottle of CHG soap to a CLEAN washcloth.  Apply CHG soap ONLY FROM YOUR NECK DOWN TO YOUR TOES (washing for 3-5 minutes)  DO NOT use CHG soap on face, private areas, open wounds, or sores.  Pay special attention to the area where your surgery is being performed.  If you are  having back surgery, having someone wash your back for you may be helpful. Wait 2 minutes after CHG soap is applied, then you may rinse off the CHG soap.  Pat dry with a clean towel  Put on clean clothes/pajamas    Additional instructions for the day of surgery: DO NOT APPLY any lotions, deodorants, cologne, or perfumes.   Do not wear jewelry or makeup Do not wear nail polish, gel polish, artificial nails, or any other type of covering on natural nails (fingers and toes) Do not bring valuables to the hospital. [facility] is not responsible for valuables/personal belongings. Put on clean/comfortable clothes.  Please brush your teeth.  Ask your nurse before applying any prescription medications to the skin.

## 2023-04-13 ENCOUNTER — Encounter (HOSPITAL_COMMUNITY)
Admission: RE | Admit: 2023-04-13 | Discharge: 2023-04-13 | Disposition: A | Discharge status: Home / Self Care | Payer: 59 | Source: Ambulatory Visit | Attending: Vascular Surgery | Admitting: Vascular Surgery

## 2023-04-13 ENCOUNTER — Other Ambulatory Visit: Payer: Self-pay

## 2023-04-13 ENCOUNTER — Encounter (HOSPITAL_COMMUNITY): Payer: Self-pay

## 2023-04-13 VITALS — BP 126/65 | HR 71 | Temp 98.3°F | Resp 18 | Ht 59.0 in | Wt 152.6 lb

## 2023-04-13 DIAGNOSIS — I70213 Atherosclerosis of native arteries of extremities with intermittent claudication, bilateral legs: Secondary | ICD-10-CM | POA: Diagnosis not present

## 2023-04-13 DIAGNOSIS — Z01812 Encounter for preprocedural laboratory examination: Secondary | ICD-10-CM | POA: Insufficient documentation

## 2023-04-13 DIAGNOSIS — E785 Hyperlipidemia, unspecified: Secondary | ICD-10-CM | POA: Diagnosis not present

## 2023-04-13 DIAGNOSIS — Z01818 Encounter for other preprocedural examination: Secondary | ICD-10-CM

## 2023-04-13 DIAGNOSIS — N183 Chronic kidney disease, stage 3 unspecified: Secondary | ICD-10-CM | POA: Diagnosis not present

## 2023-04-13 HISTORY — DX: Abdominal aortic aneurysm, without rupture, unspecified: I71.40

## 2023-04-13 LAB — COMPREHENSIVE METABOLIC PANEL
ALT: 20 U/L (ref 0–44)
AST: 24 U/L (ref 15–41)
Albumin: 3.6 g/dL (ref 3.5–5.0)
Alkaline Phosphatase: 83 U/L (ref 38–126)
Anion gap: 11 (ref 5–15)
BUN: 24 mg/dL — ABNORMAL HIGH (ref 8–23)
CO2: 30 mmol/L (ref 22–32)
Calcium: 9 mg/dL (ref 8.9–10.3)
Chloride: 101 mmol/L (ref 98–111)
Creatinine, Ser: 1.19 mg/dL — ABNORMAL HIGH (ref 0.44–1.00)
GFR, Estimated: 50 mL/min — ABNORMAL LOW (ref >60)
Glucose, Bld: 102 mg/dL — ABNORMAL HIGH (ref 70–99)
Potassium: 4.2 mmol/L (ref 3.5–5.1)
Sodium: 142 mmol/L (ref 135–145)
Total Bilirubin: 0.4 mg/dL (ref 0.3–1.2)
Total Protein: 7.5 g/dL (ref 6.5–8.1)

## 2023-04-13 LAB — URINALYSIS, ROUTINE W REFLEX MICROSCOPIC
Bacteria, UA: NONE SEEN
Bilirubin Urine: NEGATIVE
Glucose, UA: NEGATIVE mg/dL
Ketones, ur: NEGATIVE mg/dL
Leukocytes,Ua: NEGATIVE
Nitrite: NEGATIVE
Protein, ur: NEGATIVE mg/dL
Specific Gravity, Urine: 1.005 (ref 1.005–1.030)
pH: 6 (ref 5.0–8.0)

## 2023-04-13 LAB — CBC
HCT: 40.6 % (ref 36.0–46.0)
Hemoglobin: 12.6 g/dL (ref 12.0–15.0)
MCH: 30.1 pg (ref 26.0–34.0)
MCHC: 31 g/dL (ref 30.0–36.0)
MCV: 96.9 fL (ref 80.0–100.0)
Platelets: 244 10*3/uL (ref 150–400)
RBC: 4.19 MIL/uL (ref 3.87–5.11)
RDW: 12.9 % (ref 11.5–15.5)
WBC: 10.5 10*3/uL (ref 4.0–10.5)
nRBC: 0 % (ref 0.0–0.2)

## 2023-04-13 LAB — PROTIME-INR
INR: 1.1 (ref 0.8–1.2)
Prothrombin Time: 14.1 seconds (ref 11.4–15.2)

## 2023-04-13 LAB — SURGICAL PCR SCREEN
MRSA, PCR: NEGATIVE
Staphylococcus aureus: NEGATIVE

## 2023-04-13 LAB — APTT: aPTT: 29 seconds (ref 24–36)

## 2023-04-13 NOTE — Progress Notes (Signed)
PCP - Dr. Eloisa Northern Cardiologist - Dr. Lance Muss (Newly established for cardiac clearance for vascular surgery)  PPM/ICD - Denies Device Orders - n/a Rep Notified - n/a  Chest x-ray - 12/19/2022 EKG - 03/03/2023 Stress Test - 03/11/2023 ECHO - Denies Cardiac Cath - 02/20/2012 - Dark spot was seen on stress test completed in 2013 and heart cath was a follow-up to determine if it was heart damage. Cath was normal.  Sleep Study - Denies CPAP - n/a  No DM  Last dose of GLP1 agonist- n/a GLP1 instructions: n/a  Blood Thinner Instructions: n/a Aspirin Instructions: Pt will continue to take her ASA through the morning of surgery  NPO after midnight  COVID TEST- n/a   Anesthesia review: Yes. Cardiac Clearance obtained.   Patient denies shortness of breath, fever, cough and chest pain at PAT appointment. Pt denies any respiratory illness/infection in the last two months.   All instructions explained to the patient, with a verbal understanding of the material. Patient agrees to go over the instructions while at home for a better understanding. Patient also instructed to self quarantine after being tested for COVID-19. The opportunity to ask questions was provided.

## 2023-04-14 NOTE — Progress Notes (Signed)
Anesthesia Chart Review:  67 yo female with pertinent hx of PAD, HLD, CKD3. He was referred to Dr. Eldridge Dace for preop cardiac eval. Nuclear stress was ordered for risk stratificaiton. Test done 03/11/23 was low risk and showed normal perfusion. Pt subsequently cleared for surgery in telephone encounter 03/19/23 stating, "Chart reviewed as part of pre-operative protocol coverage. Given past medical history and time since last visit, based on ACC/AHA guidelines, Iline W Kanode would be at acceptable risk for the planned procedure without further cardiovascular testing. Her RCRI is a class I risk, 0.4% risk of major cardiac event."  Preop labs reviewed, creatinine mildly elevated 1.19, otherwise unremarkable.   EKG 03/03/23: NSR. Rate 60. Rightward axis.   Nuclear stress 03/11/23:   The ECG shows premature atrial contractions.   A pharmacological stress test was performed using IV Lexiscan 0.4mg  over 10 seconds performed without concurrent submaximal exercise. The patient reported nausea during the stress test. Normal blood pressure and normal heart rate response noted during stress. Heart rate recovery was normal.   No ST deviation was noted. ECG was interpretable and without significant changes. The ECG was not diagnostic due to pharmacologic protocol.   Breast attenuation artifact was present.   LV perfusion is normal. There is no evidence of ischemia. There is no evidence of infarction.   Left ventricular function is normal. Nuclear stress EF: 61 %. The left ventricular ejection fraction is normal (55-65%). End diastolic cavity size is normal. End systolic cavity size is normal.   The study is normal. The study is low risk.   Zannie Cove [facility] Short Stay Center/Anesthesiology Phone [phone] 04/14/2023 3:37 PM

## 2023-04-14 NOTE — Anesthesia Preprocedure Evaluation (Addendum)
Anesthesia Evaluation  Patient identified by MRN, date of birth, ID band Patient awake    Reviewed: Allergy & Precautions, H&P , NPO status , Patient's Chart, lab work & pertinent test results  Airway Mallampati: II  TM Distance: >3 FB Neck ROM: Full    Dental  (+) Edentulous Upper, Edentulous Lower   Pulmonary former smoker   Pulmonary exam normal breath sounds clear to auscultation       Cardiovascular + Peripheral Vascular Disease  Normal cardiovascular exam Rhythm:Regular Rate:Normal     Neuro/Psych negative neurological ROS  negative psych ROS   GI/Hepatic negative GI ROS, Neg liver ROS,,,  Endo/Other  negative endocrine ROS    Renal/GU Renal InsufficiencyRenal disease  negative genitourinary   Musculoskeletal negative musculoskeletal ROS (+)    Abdominal  (+) + obese  Peds negative pediatric ROS (+)  Hematology negative hematology ROS (+)   Anesthesia Other Findings   Reproductive/Obstetrics negative OB ROS                             Anesthesia Physical Anesthesia Plan  ASA: 3  Anesthesia Plan: General   Post-op Pain Management: Dilaudid IV   Induction: Intravenous  PONV Risk Score and Plan: 3 and Ondansetron, Dexamethasone and Midazolam  Airway Management Planned: Oral ETT  Additional Equipment: Arterial line  Intra-op Plan:   Post-operative Plan: Extubation in OR  Informed Consent: I have reviewed the patients History and Physical, chart, labs and discussed the procedure including the risks, benefits and alternatives for the proposed anesthesia with the patient or authorized representative who has indicated his/her understanding and acceptance.     Dental advisory given  Plan Discussed with: CRNA  Anesthesia Plan Comments: (PAT note by Antionette Poles, PA-C: 67 yo female with pertinent hx of PAD, HLD, CKD3. He was referred to Dr. Eldridge Dace for preop cardiac eval.  Nuclear stress was ordered for risk stratificaiton. Test done 03/11/23 was low risk and showed normal perfusion. Pt subsequently cleared for surgery in telephone encounter 03/19/23 stating, "Chart reviewed as part of pre-operative protocol coverage. Given past medical history and time since last visit, based on ACC/AHA guidelines, Fedra W Abbondanza would be at acceptable risk for the planned procedure without further cardiovascular testing. Her RCRI is a class I risk, 0.4% risk of major cardiac event."  Preop labs reviewed, creatinine mildly elevated 1.19, otherwise unremarkable.   EKG 03/03/23: NSR. Rate 60. Rightward axis.   Nuclear stress 03/11/23:   The ECG shows premature atrial contractions.   A pharmacological stress test was performed using IV Lexiscan 0.4mg  over 10 seconds performed without concurrent submaximal exercise. The patient reported nausea during the stress test. Normal blood pressure and normal heart rate response noted during stress. Heart rate recovery was normal.   No ST deviation was noted. ECG was interpretable and without significant changes. The ECG was not diagnostic due to pharmacologic protocol.   Breast attenuation artifact was present.   LV perfusion is normal. There is no evidence of ischemia. There is no evidence of infarction.   Left ventricular function is normal. Nuclear stress EF: 61 %. The left ventricular ejection fraction is normal (55-65%). End diastolic cavity size is normal. End systolic cavity size is normal.   The study is normal. The study is low risk.  )        Anesthesia Quick Evaluation

## 2023-04-21 ENCOUNTER — Other Ambulatory Visit: Payer: Self-pay

## 2023-04-21 ENCOUNTER — Inpatient Hospital Stay (HOSPITAL_COMMUNITY): Payer: 59 | Admitting: Physician Assistant

## 2023-04-21 ENCOUNTER — Encounter (HOSPITAL_COMMUNITY)
Admission: RE | Disposition: A | Discharge status: Home Health | Payer: Self-pay | Source: Home / Self Care | Attending: Vascular Surgery

## 2023-04-21 ENCOUNTER — Inpatient Hospital Stay (HOSPITAL_COMMUNITY): Payer: 59

## 2023-04-21 ENCOUNTER — Inpatient Hospital Stay (HOSPITAL_COMMUNITY): Payer: 59 | Admitting: Anesthesiology

## 2023-04-21 ENCOUNTER — Encounter (HOSPITAL_COMMUNITY): Payer: Self-pay | Admitting: Vascular Surgery

## 2023-04-21 ENCOUNTER — Inpatient Hospital Stay (HOSPITAL_COMMUNITY)
Admission: RE | Admit: 2023-04-21 | Discharge: 2023-04-27 | DRG: 270 | Disposition: A | Discharge status: Home Health | Payer: 59 | Attending: Vascular Surgery | Admitting: Vascular Surgery

## 2023-04-21 DIAGNOSIS — Z7982 Long term (current) use of aspirin: Secondary | ICD-10-CM

## 2023-04-21 DIAGNOSIS — M25562 Pain in left knee: Secondary | ICD-10-CM | POA: Diagnosis not present

## 2023-04-21 DIAGNOSIS — Z79899 Other long term (current) drug therapy: Secondary | ICD-10-CM

## 2023-04-21 DIAGNOSIS — K683 Retroperitoneal hematoma: Secondary | ICD-10-CM | POA: Diagnosis not present

## 2023-04-21 DIAGNOSIS — F17210 Nicotine dependence, cigarettes, uncomplicated: Secondary | ICD-10-CM

## 2023-04-21 DIAGNOSIS — N183 Chronic kidney disease, stage 3 unspecified: Secondary | ICD-10-CM | POA: Diagnosis present

## 2023-04-21 DIAGNOSIS — I959 Hypotension, unspecified: Secondary | ICD-10-CM | POA: Diagnosis not present

## 2023-04-21 DIAGNOSIS — E785 Hyperlipidemia, unspecified: Secondary | ICD-10-CM | POA: Diagnosis present

## 2023-04-21 DIAGNOSIS — Z8249 Family history of ischemic heart disease and other diseases of the circulatory system: Secondary | ICD-10-CM | POA: Diagnosis not present

## 2023-04-21 DIAGNOSIS — I2510 Atherosclerotic heart disease of native coronary artery without angina pectoris: Secondary | ICD-10-CM | POA: Diagnosis not present

## 2023-04-21 DIAGNOSIS — Z888 Allergy status to other drugs, medicaments and biological substances status: Secondary | ICD-10-CM

## 2023-04-21 DIAGNOSIS — Z87891 Personal history of nicotine dependence: Secondary | ICD-10-CM

## 2023-04-21 DIAGNOSIS — Z825 Family history of asthma and other chronic lower respiratory diseases: Secondary | ICD-10-CM | POA: Diagnosis not present

## 2023-04-21 DIAGNOSIS — Z9889 Other specified postprocedural states: Secondary | ICD-10-CM | POA: Diagnosis not present

## 2023-04-21 DIAGNOSIS — I70263 Atherosclerosis of native arteries of extremities with gangrene, bilateral legs: Secondary | ICD-10-CM | POA: Diagnosis not present

## 2023-04-21 DIAGNOSIS — Z885 Allergy status to narcotic agent status: Secondary | ICD-10-CM | POA: Diagnosis not present

## 2023-04-21 DIAGNOSIS — Z95828 Presence of other vascular implants and grafts: Principal | ICD-10-CM

## 2023-04-21 DIAGNOSIS — E669 Obesity, unspecified: Secondary | ICD-10-CM

## 2023-04-21 DIAGNOSIS — I77811 Abdominal aortic ectasia: Secondary | ICD-10-CM | POA: Diagnosis not present

## 2023-04-21 DIAGNOSIS — Z6830 Body mass index (BMI) 30.0-30.9, adult: Secondary | ICD-10-CM

## 2023-04-21 DIAGNOSIS — I700 Atherosclerosis of aorta: Secondary | ICD-10-CM

## 2023-04-21 DIAGNOSIS — I70213 Atherosclerosis of native arteries of extremities with intermittent claudication, bilateral legs: Secondary | ICD-10-CM

## 2023-04-21 HISTORY — PX: AORTA - BILATERAL FEMORAL ARTERY BYPASS GRAFT: SHX1175

## 2023-04-21 LAB — BPAM RBC
Blood Product Expiration Date: 202408162359
Blood Product Expiration Date: 202408162359
Blood Product Expiration Date: 202408172359
Blood Product Expiration Date: 202408172359
Blood Product Expiration Date: 202408172359
ISSUE DATE / TIME: 202407230746
ISSUE DATE / TIME: 202407230746
Unit Type and Rh: 6200
Unit Type and Rh: 6200
Unit Type and Rh: 6200
Unit Type and Rh: 6200
Unit Type and Rh: 6200

## 2023-04-21 LAB — TYPE AND SCREEN
Unit division: 0
Unit division: 0
Unit division: 0
Unit division: 0
Unit division: 0

## 2023-04-21 LAB — POCT I-STAT 7, (LYTES, BLD GAS, ICA,H+H)
Acid-base deficit: 3 mmol/L — ABNORMAL HIGH (ref 0.0–2.0)
Bicarbonate: 23.5 mmol/L (ref 20.0–28.0)
Calcium, Ion: 1.08 mmol/L — ABNORMAL LOW (ref 1.15–1.40)
HCT: 39 % (ref 36.0–46.0)
Hemoglobin: 13.3 g/dL (ref 12.0–15.0)
O2 Saturation: 95 %
Patient temperature: 97.9
Potassium: 4.9 mmol/L (ref 3.5–5.1)
Sodium: 139 mmol/L (ref 135–145)
TCO2: 25 mmol/L (ref 22–32)
pCO2 arterial: 47.1 mmHg (ref 32–48)
pH, Arterial: 7.304 — ABNORMAL LOW (ref 7.35–7.45)
pO2, Arterial: 85 mmHg (ref 83–108)

## 2023-04-21 LAB — BASIC METABOLIC PANEL
Anion gap: 10 (ref 5–15)
BUN: 15 mg/dL (ref 8–23)
CO2: 22 mmol/L (ref 22–32)
Calcium: 7.9 mg/dL — ABNORMAL LOW (ref 8.9–10.3)
Chloride: 108 mmol/L (ref 98–111)
Creatinine, Ser: 1.14 mg/dL — ABNORMAL HIGH (ref 0.44–1.00)
GFR, Estimated: 53 mL/min — ABNORMAL LOW (ref >60)
Glucose, Bld: 189 mg/dL — ABNORMAL HIGH (ref 70–99)
Potassium: 4.4 mmol/L (ref 3.5–5.1)
Sodium: 140 mmol/L (ref 135–145)

## 2023-04-21 LAB — CBC
HCT: 39.6 % (ref 36.0–46.0)
Hemoglobin: 13.5 g/dL (ref 12.0–15.0)
MCH: 31.2 pg (ref 26.0–34.0)
MCHC: 34.1 g/dL (ref 30.0–36.0)
MCV: 91.5 fL (ref 80.0–100.0)
Platelets: 116 10*3/uL — ABNORMAL LOW (ref 150–400)
RBC: 4.33 MIL/uL (ref 3.87–5.11)
RDW: 13.2 % (ref 11.5–15.5)
WBC: 16.2 10*3/uL — ABNORMAL HIGH (ref 4.0–10.5)
nRBC: 0 % (ref 0.0–0.2)

## 2023-04-21 LAB — PROTIME-INR
INR: 1.7 — ABNORMAL HIGH (ref 0.8–1.2)
Prothrombin Time: 20.3 seconds — ABNORMAL HIGH (ref 11.4–15.2)

## 2023-04-21 LAB — MAGNESIUM: Magnesium: 1.4 mg/dL — ABNORMAL LOW (ref 1.7–2.4)

## 2023-04-21 LAB — ABO/RH: ABO/RH(D): A POS

## 2023-04-21 LAB — PREPARE RBC (CROSSMATCH)

## 2023-04-21 LAB — APTT: aPTT: 34 seconds (ref 24–36)

## 2023-04-21 SURGERY — CREATION, BYPASS, ARTERIAL, AORTA TO FEMORAL, BILATERAL, USING GRAFT
Anesthesia: General | Site: Abdomen

## 2023-04-21 MED ORDER — CEFAZOLIN SODIUM-DEXTROSE 2-4 GM/100ML-% IV SOLN
2.0000 g | Freq: Three times a day (TID) | INTRAVENOUS | Status: AC
  Administered 2023-04-21 (×2): 2 g via INTRAVENOUS
  Filled 2023-04-21 (×2): qty 100

## 2023-04-21 MED ORDER — CLEVIDIPINE BUTYRATE 0.5 MG/ML IV EMUL
INTRAVENOUS | Status: AC
  Filled 2023-04-21: qty 50

## 2023-04-21 MED ORDER — PROPOFOL 10 MG/ML IV BOLUS
INTRAVENOUS | Status: AC
  Filled 2023-04-21: qty 20

## 2023-04-21 MED ORDER — LIDOCAINE 2% (20 MG/ML) 5 ML SYRINGE
INTRAMUSCULAR | Status: DC | PRN
  Administered 2023-04-21: 40 mg via INTRAVENOUS

## 2023-04-21 MED ORDER — LACTATED RINGERS IV SOLN: INTRAVENOUS | Status: DC | PRN

## 2023-04-21 MED ORDER — OXYCODONE HCL 5 MG PO TABS: 5.0000 mg | ORAL_TABLET | Freq: Once | ORAL | Status: DC | PRN

## 2023-04-21 MED ORDER — HYDRALAZINE HCL 20 MG/ML IJ SOLN: 5.0000 mg | INTRAMUSCULAR | Status: DC | PRN

## 2023-04-21 MED ORDER — MIDAZOLAM HCL (PF) 10 MG/2ML IJ SOLN
INTRAMUSCULAR | Status: AC
  Filled 2023-04-21: qty 2

## 2023-04-21 MED ORDER — OXYCODONE HCL 5 MG/5ML PO SOLN: 5.0000 mg | Freq: Once | ORAL | Status: DC | PRN

## 2023-04-21 MED ORDER — LABETALOL HCL 5 MG/ML IV SOLN
INTRAVENOUS | Status: DC | PRN
  Administered 2023-04-21: 5 mg via INTRAVENOUS

## 2023-04-21 MED ORDER — SUGAMMADEX SODIUM 200 MG/2ML IV SOLN
INTRAVENOUS | Status: DC | PRN
  Administered 2023-04-21: 136 mg via INTRAVENOUS

## 2023-04-21 MED ORDER — LIDOCAINE-EPINEPHRINE (PF) 1 %-1:200000 IJ SOLN
INTRAMUSCULAR | Status: DC | PRN
  Administered 2023-04-21: 30 mL

## 2023-04-21 MED ORDER — FENTANYL CITRATE (PF) 250 MCG/5ML IJ SOLN
INTRAMUSCULAR | Status: AC
  Filled 2023-04-21: qty 5

## 2023-04-21 MED ORDER — ONDANSETRON HCL 4 MG/2ML IJ SOLN
INTRAMUSCULAR | Status: DC | PRN
  Administered 2023-04-21: 8 mg via INTRAVENOUS

## 2023-04-21 MED ORDER — CALCIUM CHLORIDE 10 % IV SOLN
INTRAVENOUS | Status: AC
  Filled 2023-04-21: qty 10

## 2023-04-21 MED ORDER — ONDANSETRON HCL 4 MG/2ML IJ SOLN
4.0000 mg | Freq: Four times a day (QID) | INTRAMUSCULAR | Status: DC | PRN
  Administered 2023-04-21 – 2023-04-22 (×2): 4 mg via INTRAVENOUS
  Filled 2023-04-21: qty 2

## 2023-04-21 MED ORDER — 0.9 % SODIUM CHLORIDE (POUR BTL) OPTIME
TOPICAL | Status: DC | PRN
  Administered 2023-04-21: 2000 mL

## 2023-04-21 MED ORDER — PHENYLEPHRINE 80 MCG/ML (10ML) SYRINGE FOR IV PUSH (FOR BLOOD PRESSURE SUPPORT)
PREFILLED_SYRINGE | INTRAVENOUS | Status: AC
  Filled 2023-04-21: qty 40

## 2023-04-21 MED ORDER — PANTOPRAZOLE SODIUM 40 MG IV SOLR
40.0000 mg | Freq: Every day | INTRAVENOUS | Status: DC
  Administered 2023-04-21 – 2023-04-22 (×2): 40 mg via INTRAVENOUS
  Filled 2023-04-21 (×2): qty 10

## 2023-04-21 MED ORDER — SODIUM CHLORIDE 0.9 % IV SOLN: INTRAVENOUS | Status: DC

## 2023-04-21 MED ORDER — METOPROLOL TARTRATE 5 MG/5ML IV SOLN: 2.0000 mg | INTRAVENOUS | Status: DC | PRN

## 2023-04-21 MED ORDER — FENTANYL CITRATE (PF) 250 MCG/5ML IJ SOLN
INTRAMUSCULAR | Status: DC | PRN
  Administered 2023-04-21 (×5): 50 ug via INTRAVENOUS
  Administered 2023-04-21: 25 ug via INTRAVENOUS
  Administered 2023-04-21: 100 ug via INTRAVENOUS

## 2023-04-21 MED ORDER — PROTAMINE SULFATE 10 MG/ML IV SOLN
INTRAVENOUS | Status: DC | PRN
  Administered 2023-04-21: 5 mg via INTRAVENOUS
  Administered 2023-04-21: 10 mg via INTRAVENOUS
  Administered 2023-04-21: 5 mg via INTRAVENOUS
  Administered 2023-04-21: 10 mg via INTRAVENOUS
  Administered 2023-04-21: 20 mg via INTRAVENOUS
  Administered 2023-04-21 (×2): 10 mg via INTRAVENOUS
  Administered 2023-04-21: 5 mg via INTRAVENOUS

## 2023-04-21 MED ORDER — HEPARIN 6000 UNIT IRRIGATION SOLUTION
Status: DC | PRN
  Administered 2023-04-21: 1

## 2023-04-21 MED ORDER — PHENOL 1.4 % MT LIQD: 1.0000 | OROMUCOSAL | Status: DC | PRN

## 2023-04-21 MED ORDER — CLEVIDIPINE BUTYRATE 0.5 MG/ML IV EMUL
INTRAVENOUS | Status: DC | PRN
  Administered 2023-04-21: 1 mg/h via INTRAVENOUS

## 2023-04-21 MED ORDER — CALCIUM CHLORIDE 10 % IV SOLN
INTRAVENOUS | Status: DC | PRN
  Administered 2023-04-21: 200 mg via INTRAVENOUS

## 2023-04-21 MED ORDER — ALBUMIN HUMAN 5 % IV SOLN
INTRAVENOUS | Status: AC
  Administered 2023-04-21: 12.5 g via INTRAVENOUS
  Filled 2023-04-21: qty 250

## 2023-04-21 MED ORDER — HEPARIN SODIUM (PORCINE) 1000 UNIT/ML IJ SOLN
INTRAMUSCULAR | Status: DC | PRN
  Administered 2023-04-21: 3000 [IU] via INTRAVENOUS
  Administered 2023-04-21: 8000 [IU] via INTRAVENOUS

## 2023-04-21 MED ORDER — HYDROMORPHONE HCL 1 MG/ML IJ SOLN
0.5000 mg | INTRAMUSCULAR | Status: DC | PRN
  Administered 2023-04-21: 1 mg via INTRAVENOUS
  Administered 2023-04-22 (×2): 0.5 mg via INTRAVENOUS
  Administered 2023-04-22: 1 mg via INTRAVENOUS
  Administered 2023-04-22: 0.5 mg via INTRAVENOUS
  Administered 2023-04-23: 1 mg via INTRAVENOUS
  Filled 2023-04-21 (×6): qty 1

## 2023-04-21 MED ORDER — ROCURONIUM BROMIDE 10 MG/ML (PF) SYRINGE
PREFILLED_SYRINGE | INTRAVENOUS | Status: DC | PRN
  Administered 2023-04-21 (×4): 20 mg via INTRAVENOUS
  Administered 2023-04-21: 60 mg via INTRAVENOUS

## 2023-04-21 MED ORDER — ALUM & MAG HYDROXIDE-SIMETH 200-200-20 MG/5ML PO SUSP: 15.0000 mL | ORAL | Status: DC | PRN

## 2023-04-21 MED ORDER — LABETALOL HCL 5 MG/ML IV SOLN: 10.0000 mg | INTRAVENOUS | Status: DC | PRN

## 2023-04-21 MED ORDER — CHLORHEXIDINE GLUCONATE CLOTH 2 % EX PADS: 6.0000 | MEDICATED_PAD | Freq: Once | CUTANEOUS | Status: DC

## 2023-04-21 MED ORDER — CHLORHEXIDINE GLUCONATE 0.12 % MT SOLN
15.0000 mL | Freq: Once | OROMUCOSAL | Status: AC
  Administered 2023-04-21: 15 mL via OROMUCOSAL

## 2023-04-21 MED ORDER — MAGNESIUM SULFATE 2 GM/50ML IV SOLN
2.0000 g | Freq: Every day | INTRAVENOUS | Status: AC | PRN
  Administered 2023-04-22: 2 g via INTRAVENOUS
  Filled 2023-04-21: qty 50

## 2023-04-21 MED ORDER — ACETAMINOPHEN 325 MG RE SUPP: 325.0000 mg | RECTAL | Status: DC | PRN

## 2023-04-21 MED ORDER — PROMETHAZINE HCL 25 MG/ML IJ SOLN: 6.2500 mg | INTRAMUSCULAR | Status: DC | PRN

## 2023-04-21 MED ORDER — HEPARIN 6000 UNIT IRRIGATION SOLUTION
Status: AC
  Filled 2023-04-21: qty 500

## 2023-04-21 MED ORDER — HEMOSTATIC AGENTS (NO CHARGE) OPTIME
TOPICAL | Status: DC | PRN
  Administered 2023-04-21: 3 via TOPICAL
  Administered 2023-04-21: 2 via TOPICAL

## 2023-04-21 MED ORDER — ALBUMIN HUMAN 5 % IV SOLN: INTRAVENOUS | Status: DC | PRN

## 2023-04-21 MED ORDER — LIDOCAINE-EPINEPHRINE (PF) 1 %-1:200000 IJ SOLN
INTRAMUSCULAR | Status: AC
  Filled 2023-04-21: qty 30

## 2023-04-21 MED ORDER — MIDAZOLAM HCL 2 MG/2ML IJ SOLN
INTRAMUSCULAR | Status: AC
  Filled 2023-04-21: qty 2

## 2023-04-21 MED ORDER — POTASSIUM CHLORIDE CRYS ER 20 MEQ PO TBCR: 20.0000 meq | EXTENDED_RELEASE_TABLET | Freq: Every day | ORAL | Status: DC | PRN

## 2023-04-21 MED ORDER — KCL IN DEXTROSE-NACL 20-5-0.45 MEQ/L-%-% IV SOLN
INTRAVENOUS | Status: DC
  Filled 2023-04-21 (×4): qty 1000

## 2023-04-21 MED ORDER — HYDROMORPHONE HCL 1 MG/ML IJ SOLN: 0.2500 mg | INTRAMUSCULAR | Status: DC | PRN

## 2023-04-21 MED ORDER — ORAL CARE MOUTH RINSE: 15.0000 mL | Freq: Once | OROMUCOSAL | Status: AC

## 2023-04-21 MED ORDER — GUAIFENESIN-DM 100-10 MG/5ML PO SYRP: 15.0000 mL | ORAL_SOLUTION | ORAL | Status: DC | PRN

## 2023-04-21 MED ORDER — PHENYLEPHRINE 80 MCG/ML (10ML) SYRINGE FOR IV PUSH (FOR BLOOD PRESSURE SUPPORT)
PREFILLED_SYRINGE | INTRAVENOUS | Status: DC | PRN
  Administered 2023-04-21: 160 ug via INTRAVENOUS
  Administered 2023-04-21: 80 ug via INTRAVENOUS
  Administered 2023-04-21: 160 ug via INTRAVENOUS
  Administered 2023-04-21 (×2): 80 ug via INTRAVENOUS
  Administered 2023-04-21: 160 ug via INTRAVENOUS
  Administered 2023-04-21 (×2): 80 ug via INTRAVENOUS
  Administered 2023-04-21: 40 ug via INTRAVENOUS

## 2023-04-21 MED ORDER — DOPAMINE-DEXTROSE 3.2-5 MG/ML-% IV SOLN
3.0000 ug/kg/min | INTRAVENOUS | Status: DC
  Administered 2023-04-21: 3 ug/kg/min via INTRAVENOUS
  Filled 2023-04-21: qty 250

## 2023-04-21 MED ORDER — SODIUM CHLORIDE 0.9 % IV SOLN: 10.0000 mL/h | Freq: Once | INTRAVENOUS | Status: AC

## 2023-04-21 MED ORDER — ALBUMIN HUMAN 5 % IV SOLN: 12.5000 g | Freq: Once | INTRAVENOUS | Status: AC

## 2023-04-21 MED ORDER — PHENYLEPHRINE HCL-NACL 20-0.9 MG/250ML-% IV SOLN
INTRAVENOUS | Status: DC | PRN
  Administered 2023-04-21: 50 ug/min via INTRAVENOUS

## 2023-04-21 MED ORDER — AMISULPRIDE (ANTIEMETIC) 5 MG/2ML IV SOLN: 10.0000 mg | Freq: Once | INTRAVENOUS | Status: DC | PRN

## 2023-04-21 MED ORDER — PROTAMINE SULFATE 10 MG/ML IV SOLN
INTRAVENOUS | Status: AC
  Filled 2023-04-21: qty 5

## 2023-04-21 MED ORDER — LACTATED RINGERS IV SOLN: INTRAVENOUS | Status: DC

## 2023-04-21 MED ORDER — DIPHENHYDRAMINE HCL 50 MG/ML IJ SOLN
INTRAMUSCULAR | Status: DC | PRN
  Administered 2023-04-21: 25 mg via INTRAVENOUS

## 2023-04-21 MED ORDER — EPHEDRINE 5 MG/ML INJ
INTRAVENOUS | Status: AC
  Filled 2023-04-21: qty 20

## 2023-04-21 MED ORDER — CHLORHEXIDINE GLUCONATE CLOTH 2 % EX PADS
6.0000 | MEDICATED_PAD | Freq: Every day | CUTANEOUS | Status: DC
  Administered 2023-04-21 – 2023-04-27 (×5): 6 via TOPICAL

## 2023-04-21 MED ORDER — CEFAZOLIN SODIUM-DEXTROSE 2-4 GM/100ML-% IV SOLN
2.0000 g | INTRAVENOUS | Status: AC
  Administered 2023-04-21: 2 g via INTRAVENOUS
  Filled 2023-04-21: qty 100

## 2023-04-21 MED ORDER — MIDAZOLAM HCL 2 MG/2ML IJ SOLN
INTRAMUSCULAR | Status: DC | PRN
  Administered 2023-04-21: 2 mg via INTRAVENOUS

## 2023-04-21 MED ORDER — PROPOFOL 10 MG/ML IV BOLUS
INTRAVENOUS | Status: DC | PRN
Start: 2023-04-21 — End: 2023-04-21
  Administered 2023-04-21: 120 mg via INTRAVENOUS

## 2023-04-21 MED ORDER — SODIUM CHLORIDE 0.9 % IV SOLN
500.0000 mL | Freq: Once | INTRAVENOUS | Status: AC | PRN
  Administered 2023-04-21: 500 mL via INTRAVENOUS

## 2023-04-21 MED ORDER — BISACODYL 10 MG RE SUPP: 10.0000 mg | Freq: Every day | RECTAL | Status: DC | PRN

## 2023-04-21 MED ORDER — CHLORHEXIDINE GLUCONATE 0.12 % MT SOLN
OROMUCOSAL | Status: AC
  Filled 2023-04-21: qty 15

## 2023-04-21 MED ORDER — ACETAMINOPHEN 160 MG/5ML PO SOLN
325.0000 mg | ORAL | Status: DC | PRN
  Administered 2023-04-24: 650 mg
  Filled 2023-04-21: qty 20.3

## 2023-04-21 MED ORDER — EPHEDRINE SULFATE-NACL 50-0.9 MG/10ML-% IV SOSY
PREFILLED_SYRINGE | INTRAVENOUS | Status: DC | PRN
  Administered 2023-04-21: 10 mg via INTRAVENOUS
  Administered 2023-04-21: 5 mg via INTRAVENOUS
  Administered 2023-04-21: 10 mg via INTRAVENOUS

## 2023-04-21 SURGICAL SUPPLY — 58 items
ADH SKN CLS APL DERMABOND .7 (GAUZE/BANDAGES/DRESSINGS) ×4
BAG COUNTER SPONGE SURGICOUNT (BAG) ×1 IMPLANT
BAG SPNG CNTER NS LX DISP (BAG) ×1
CANISTER SUCT 3000ML PPV (MISCELLANEOUS) ×1 IMPLANT
CLIP LIGATING EXTRA MED SLVR (CLIP) ×1 IMPLANT
CLIP LIGATING EXTRA SM BLUE (MISCELLANEOUS) ×1 IMPLANT
COVER ULTRASOUND PROBE 36 ST (MISCELLANEOUS) IMPLANT
DERMABOND ADVANCED .7 DNX12 (GAUZE/BANDAGES/DRESSINGS) ×2 IMPLANT
ELECT BLADE 4.0 EZ CLEAN MEGAD (MISCELLANEOUS) ×1
ELECT BLADE 6.5 EXT (BLADE) IMPLANT
ELECT REM PT RETURN 9FT ADLT (ELECTROSURGICAL) ×1
ELECTRODE BLDE 4.0 EZ CLN MEGD (MISCELLANEOUS) ×1 IMPLANT
ELECTRODE REM PT RTRN 9FT ADLT (ELECTROSURGICAL) ×1 IMPLANT
FELT TEFLON 1X6 (MISCELLANEOUS) IMPLANT
GAUZE 4X4 16PLY ~~LOC~~+RFID DBL (SPONGE) IMPLANT
GLOVE BIO SURGEON STRL SZ7.5 (GLOVE) ×1 IMPLANT
GOWN STRL REUS W/ TWL LRG LVL3 (GOWN DISPOSABLE) ×2 IMPLANT
GOWN STRL REUS W/ TWL XL LVL3 (GOWN DISPOSABLE) ×1 IMPLANT
GOWN STRL REUS W/TWL LRG LVL3 (GOWN DISPOSABLE) ×2
GOWN STRL REUS W/TWL XL LVL3 (GOWN DISPOSABLE) ×1
GRAFT HEMASHIELD 14X7MM (Vascular Products) IMPLANT
HEMOSTAT SNOW SURGICEL 2X4 (HEMOSTASIS) IMPLANT
INSERT FOGARTY 61MM (MISCELLANEOUS) IMPLANT
INSERT FOGARTY SM (MISCELLANEOUS) ×2 IMPLANT
KIT BASIN OR (CUSTOM PROCEDURE TRAY) ×1 IMPLANT
KIT TURNOVER KIT B (KITS) ×1 IMPLANT
NDL HYPO 25GX1X1/2 BEV (NEEDLE) IMPLANT
NEEDLE HYPO 25GX1X1/2 BEV (NEEDLE) ×1 IMPLANT
NS IRRIG 1000ML POUR BTL (IV SOLUTION) ×2 IMPLANT
PACK AORTA (CUSTOM PROCEDURE TRAY) ×1 IMPLANT
PAD ARMBOARD 7.5X6 YLW CONV (MISCELLANEOUS) ×2 IMPLANT
POWDER SURGICEL 3.0 GRAM (HEMOSTASIS) IMPLANT
SPONGE T-LAP 18X18 ~~LOC~~+RFID (SPONGE) IMPLANT
SUT MNCRL AB 4-0 PS2 18 (SUTURE) ×2 IMPLANT
SUT PDS AB 1 TP1 54 (SUTURE) ×2 IMPLANT
SUT PROLENE 3 0 SH 48 (SUTURE) ×1 IMPLANT
SUT PROLENE 4 0 SH DA (SUTURE) IMPLANT
SUT PROLENE 5 0 C 1 24 (SUTURE) ×2 IMPLANT
SUT PROLENE 5 0 C 1 36 (SUTURE) IMPLANT
SUT SILK 2 0 (SUTURE) ×1
SUT SILK 2 0 TIES 17X18 (SUTURE) ×1
SUT SILK 2 0SH CR/8 30 (SUTURE) ×1 IMPLANT
SUT SILK 2-0 18XBRD TIE 12 (SUTURE) ×1 IMPLANT
SUT SILK 2-0 18XBRD TIE BLK (SUTURE) ×1 IMPLANT
SUT SILK 3 0 (SUTURE) ×1
SUT SILK 3 0 TIES 17X18 (SUTURE) ×1
SUT SILK 3-0 18XBRD TIE 12 (SUTURE) ×1 IMPLANT
SUT SILK 3-0 18XBRD TIE BLK (SUTURE) ×1 IMPLANT
SUT VIC AB 2-0 CT1 27 (SUTURE) ×8
SUT VIC AB 2-0 CT1 TAPERPNT 27 (SUTURE) ×5 IMPLANT
SUT VIC AB 3-0 SH 27 (SUTURE) ×4
SUT VIC AB 3-0 SH 27X BRD (SUTURE) ×2 IMPLANT
SYR CONTROL 10ML LL (SYRINGE) IMPLANT
TOWEL GREEN STERILE (TOWEL DISPOSABLE) ×1 IMPLANT
TOWEL ~~LOC~~+RFID 17X26 BLUE (SPONGE) IMPLANT
TRAY FOLEY MTR SLVR 16FR STAT (SET/KITS/TRAYS/PACK) ×1 IMPLANT
TUBING ANTICOAG CELL SAVER (IV SETS) IMPLANT
WATER STERILE IRR 1000ML POUR (IV SOLUTION) ×2 IMPLANT

## 2023-04-21 NOTE — Anesthesia Procedure Notes (Signed)
Procedure Name: Intubation Date/Time: 04/21/2023 7:46 AM  Performed by: Darryl Nestle, CRNAPre-anesthesia Checklist: Patient identified, Emergency Drugs available, Suction available and Patient being monitored Patient Re-evaluated:Patient Re-evaluated prior to induction Oxygen Delivery Method: Circle system utilized Preoxygenation: Pre-oxygenation with 100% oxygen Induction Type: IV induction Ventilation: Mask ventilation without difficulty Laryngoscope Size: Mac and 3 Grade View: Grade I Tube type: Oral Tube size: 7.0 mm Number of attempts: 1 Airway Equipment and Method: Stylet and Oral airway Placement Confirmation: ETT inserted through vocal cords under direct vision, positive ETCO2 and breath sounds checked- equal and bilateral Secured at: 21 cm Tube secured with: Tape Dental Injury: Teeth and Oropharynx as per pre-operative assessment

## 2023-04-21 NOTE — Transfer of Care (Signed)
Immediate Anesthesia Transfer of Care Note  Patient: Jane Miller  Procedure(s) Performed: AORTOBIFEMORAL BYPASS GRAFT USING HEMASHIELD GOLD VASCULAR GRAFT (Abdomen)  Patient Location: PACU  Anesthesia Type:General  Level of Consciousness: awake and alert   Airway & Oxygen Therapy: Patient Spontanous Breathing and Patient connected to face mask oxygen  Post-op Assessment: Report given to RN and Post -op Vital signs reviewed and stable  Post vital signs: stable  Last Vitals:  Vitals Value Taken Time  BP 111/77 04/21/23 1315  Temp    Pulse 55 04/21/23 1317  Resp 15 04/21/23 1317  SpO2 99 % 04/21/23 1317  Vitals shown include unfiled device data.  Last Pain:  Vitals:   04/21/23 4098  TempSrc: Oral  PainSc:       Patients Stated Pain Goal: 0 (04/21/23 0603)  Complications: There were no known notable events for this encounter.

## 2023-04-21 NOTE — Op Note (Signed)
Patient name: Jane Miller MRN: [id number] DOB: August 12, 1956 Sex: female  04/21/2023 Pre-operative Diagnosis: Bilateral lower extremity life-limiting claudication Post-operative diagnosis:  Same Surgeon:  Apolinar Junes C. Randie Heinz, MD Assistants: Gerarda Fraction, MD; Lolita Rieger, PA Procedure Performed:  Aorto bifemoral bypass with 14 x 7 mm bifurcated dacron with bilateral common femoral endarterectomies and ligation of inferior mesenteric vein and artery  Indications: 67 year old female with bilateral lower extremity life-limiting claudication with severely depressed ABIs on the right and moderately depressed on the left with aortic ectasia and occluded distal aorta.  She also has calcium femoral arteries and is indicated for bilateral common femoral endarterectomy with concomitant aortobifemoral bypass.  Experience assistants were necessary to facilitate exposure of the aorta as well as the aortic bifurcation, bilateral renal arteries and renal vein, ligation of the inferior mesenteric vein and inferior mesenteric artery as well as performing bypass of the bilateral common femoral arteries with concomitant endarterectomies.  Findings: Bilateral common femoral arteries had posterior calcified plaque endarterectomy was performed and there was strong backbleeding from both superficial femoral arteries and profunda femoris arteries and the extrailiac arteries were also patent.  The aorta itself was severely inflamed with thickened legs around the aorta and was diffusely diseased from all exposed retroperitoneal tissues at completion although able to get good tissue coverage over the aortic graft.  At completion she did not palpable dorsalis pedis pulses bilaterally.   Procedure:  The patient was identified in the holding area and taken to the operating room where she was placed upon operative table general anesthesia was induced.  She was sterilely prepped and draped in the bilateral groins and abdomen  in the usual fashion, antibiotics were administered a timeout was called.  We began with transverse incisions in the bilateral groins and dissected down bilateral common femoral artery and expose the extrailiac arteries and encircled these with Vesseloops as well as the SFA and profunda femoris arteries.  Bilateral crossing veins were divided as the profunda and external iliac artery levels and tunnels will be done.  The wounds were then packed.  Midline abdominal incision was made carried down to the midline the fascia to expose the abdomen from xiphoid to pubis.  The abdominal contents were both evaluated and noted to be normal and NG tube was confirmed in place.  Transverse colon and omentum was mobilized cephalad and the small intestine was mobilized laterally and the self-retaining retractor was placed.  Ligament of Treitz was taken down the retroperitoneum was exposed the inferior mesenteric vein was ligated for better exposure.  There was a thickened rind around the aorta and ultimately we were able to get Around with the proximal ectatic aorta and expose the left renal vein this was protected.  The bilateral renal arteries were identified and we were able to dissect around the aortic neck proximally at the level of the renal arteries were clamped which fit nicely.  We then dissected down to the bifurcation and did ligate the inferior mesenteric artery as this was severely calcified and thickened.  The aortic bifurcation was difficult to get through a very thickened rind ultimately we were able to expose the bilateral common iliac arteries and then created a tunnel from the common femoral arteries up to the iliac arteries bilaterally and placed umbilical tape to maintain location the patient was fully heparinized and a dacron graft was prepared.  After heparin had circulated for 3 minutes we clamped the aorta just below the renal arteries and then above the ligated  IMA distally where it was ectatic.  The  aorta was transected 2 cm below the renal arteries.  There was thickened and gelatinous material in calcium at the neck and the material was removed and the distal aorta was oversewn.  There was bleeding from retroperitoneal vein on the left side these were clipped and then packed off and hemostasis was obtained there.  Endarterectomy was performed the graft was trimmed to size and sewn end-to-end with 3-0 Prolene suture.  Prior to completing the anastomosis we flushed with heparinized saline and placed to repair stitches with 4-0 Prolene suture and completed our anastomosis clamped the limbs and released our aortic clamp and there was strong blood flow no need for any further repair sutures.  The limbs were then tunneled bilaterally.  We began on the right side clamping the common femoral artery outflow and inflow and opened this longitudinally and performed endarterectomy and very good backbleeding from the SFA and profunda as well as pulsatile inflow were.  He extrailiac artery although the aorta was oversewn.  We then trimmed the graft to size and sewn this into side with 5-0 Prolene suture.  Prior completion without flushing all directions.  Upon completion we had very strong signals in the SFA and profunda and actually a thrill upon palpation.  We turned attention to the left side similarly The outflow followed by the inflow and open the vessel longitudinally performed extensive endarterectomy including up to the external iliac artery down to the SFA and profund and had very strong inflow and backbleeding.  The graft was trimmed to size.  And sewn into side with 5-0 plain suture.  Again we allowed flushing all directions prior to the completion.  Upon completion again we had a thrill by palpation.  Strong signals in the SFA and profunda.  We obtain hemostasis in both groins and returned to the abdomen.  50 mg of protamine was administered.  Ultimately an additional 25 mg of protamine was necessary as there was  significant retroperitoneal bleeding.  We were able to control the bruise closely we then closed the retroperitoneum with running 2-0 Vicryl suture.  Abdominal contents were again inspected all bowel was clamped viable including the small intestine.  Sigmoid colon.  On abdominal contents were returned to the abdomen and the midline fascia was then closed with running PDS suture in the midline.  Skin was closed with 4 Monocryl.  Dermabond was placed at the level of the incision.The groins were then closed with Vicryl and Monocryl and Dermabond was placed.  Patient was then awakened from anesthesia having tolerated procedure without any complication.  All counts were correct at completion.  EBL: 1000cc  Transfusion: 400cc cell saver, 3 units prbc's  Sarra Rachels C. Randie Heinz, MD [facility] Office: [phone] Pager: [phone]

## 2023-04-21 NOTE — Progress Notes (Signed)
Lab called stating they just found a blood gas that was timed for 12pm today. Would need new collection if gas still needed. Aneta Mins, PACU RN made aware.

## 2023-04-21 NOTE — Anesthesia Procedure Notes (Addendum)
Arterial Line Insertion Start/End7/23/2024 6:45 AM, 04/21/2023 7:00 AM Performed by: Darryl Nestle, CRNA, CRNA  Patient location: Pre-op. Preanesthetic checklist: patient identified, IV checked, site marked, risks and benefits discussed, surgical consent, monitors and equipment checked, pre-op evaluation, timeout performed and anesthesia consent Lidocaine 1% used for infiltration Right, radial was placed Catheter size: 20 G Hand hygiene performed  and maximum sterile barriers used   Attempts: 2 Procedure performed using ultrasound guided technique. Ultrasound Notes:anatomy identified, needle tip was noted to be adjacent to the nerve/plexus identified and no ultrasound evidence of intravascular and/or intraneural injection Following insertion, dressing applied. Post procedure assessment: normal and unchanged  Patient tolerated the procedure well with no immediate complications.

## 2023-04-21 NOTE — H&P (Signed)
HPI:   Jane Miller is a 67 y.o. female without history of vascular disease but was a long-term smoker and had a father with peripheral vascular disease.  She continues to be free of any smoking since February 29 which was 3 months ago today.  She does have short distance claudication occasionally can walk up to 10 minutes but more recently this has been much shorter particularly on the right leg relative to the left.  She does not have any tissue loss or ulceration.  Recent underwent CT scan and is here today to follow-up on the findings.   History reviewed. No pertinent past medical history.          Family History  Problem Relation Age of Onset   COPD Mother               Past Surgical History:  Procedure Laterality Date   CESAREAN SECTION       LEFT HEART CATHETERIZATION WITH CORONARY ANGIOGRAM N/A 02/20/2012    Procedure: LEFT HEART CATHETERIZATION WITH CORONARY ANGIOGRAM;  Surgeon: Donato Schultz, MD;  Location: [facility] CATH LAB;  Service: Cardiovascular;  Laterality: N/A;          Social History         Tobacco Use   Smoking status: Former      Types: Cigarettes      Quit date: 11/27/2022      Years since quitting: 0.2   Smokeless tobacco: Not on file  Substance Use Topics   Alcohol use: No      Allergies      Allergies  Allergen Reactions   Prednisone Nausea And Vomiting        No current outpatient medications on file.      No current facility-administered medications for this visit.        Review of Systems  Constitutional:  Constitutional negative. HENT: HENT negative.  Eyes: Eyes negative.  Cardiovascular: Positive for claudication.  GI: Positive for abdominal pain.  Musculoskeletal: Musculoskeletal negative.  Skin: Skin negative.  Neurological: Neurological negative. Hematologic: Hematologic/lymphatic negative.  Psychiatric: Psychiatric negative.          Objective:    Vitals:   04/21/23 0608  BP: 136/68  Pulse: 66  Resp: 18   Temp: 97.9 F (36.6 C)  SpO2: 94%      Physical Exam Eyes:     Pupils: Pupils are equal, round, and reactive to light.  Cardiovascular:     Pulses:          Femoral pulses are 0 on the right side and 0 on the left side.    Heart sounds: Murmur heard.  Pulmonary:     Effort: Pulmonary effort is normal.  Abdominal:     General: Abdomen is flat.  Musculoskeletal:     Right lower leg: No edema.     Left lower leg: No edema.  Skin:    General: Skin is warm.     Capillary Refill: Capillary refill takes less than 2 seconds.  Neurological:     General: No focal deficit present.     Mental Status: She is alert.  Psychiatric:        Mood and Affect: Mood normal.        Thought Content: Thought content normal.        Judgment: Judgment normal.        Data: CT IMPRESSION: Advanced aortoiliac disease (TASC D) with distal aortic occlusion extending into the common  iliac arteries. Associated fusiform infrarenal aortic ectasia, estimated 2.8 cm maximum dimension, with circumferential thrombus/plaque of the ectatic segment.   Reconstitution of the bilateral iliac system at the bifurcation of CIA/EIA with minimal external iliac artery disease and common femoral artery disease bilaterally.   No significant femoropopliteal or tibial artery disease.   Cirrhotic configuration of the liver. There are 2 low-density lesions which most likely represent biliary cysts, and 1 additional lesion within segment 3 at the falciform ligament which cannot be completely characterized as a cystic lesion on this CT. Referral to hepatology/gastroenterology may be useful to initiate care/surveillance imaging, potentially MRI evaluation.      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  She has short distance claudication on the right greater than left with an absent toe pressure on the  right and severely depressed ABIs on the right with moderate on the left.  Given the presence of aortic ectasia along with distal aortic occlusion I have recommended aortobifemoral bypass.  We again discussed her risks and she demonstrates good understanding of the need for 7 to 10-day hospitalization with likely rehab upon hospital discharge and the 6 to 8-week recovery from the procedure.  All of her questions were answered and consent signed.   Endrit Gittins C. Randie Heinz, MD [facility] Office: [phone] Pager: [phone]

## 2023-04-21 NOTE — Progress Notes (Signed)
Dr. Randie Heinz made aware that patient's last dose of Aspirin 81 mg was taken yesterday. Ok per Dr. Randie Heinz.

## 2023-04-22 ENCOUNTER — Encounter (HOSPITAL_COMMUNITY): Payer: Self-pay | Admitting: Vascular Surgery

## 2023-04-22 LAB — POCT I-STAT 7, (LYTES, BLD GAS, ICA,H+H)
Acid-Base Excess: 2 mmol/L (ref 0.0–2.0)
Acid-base deficit: 1 mmol/L (ref 0.0–2.0)
Acid-base deficit: 3 mmol/L — ABNORMAL HIGH (ref 0.0–2.0)
Bicarbonate: 25.8 mmol/L (ref 20.0–28.0)
Bicarbonate: 24.2 mmol/L (ref 20.0–28.0)
Bicarbonate: 23.1 mmol/L (ref 20.0–28.0)
Calcium, Ion: 1.2 mmol/L (ref 1.15–1.40)
Calcium, Ion: 1.04 mmol/L — ABNORMAL LOW (ref 1.15–1.40)
Calcium, Ion: 1.01 mmol/L — ABNORMAL LOW (ref 1.15–1.40)
HCT: 33 % — ABNORMAL LOW (ref 36.0–46.0)
HCT: 23 % — ABNORMAL LOW (ref 36.0–46.0)
HCT: 27 % — ABNORMAL LOW (ref 36.0–46.0)
Hemoglobin: 11.2 g/dL — ABNORMAL LOW (ref 12.0–15.0)
Hemoglobin: 7.8 g/dL — ABNORMAL LOW (ref 12.0–15.0)
Hemoglobin: 9.2 g/dL — ABNORMAL LOW (ref 12.0–15.0)
O2 Saturation: 100 %
O2 Saturation: 99 %
O2 Saturation: 100 %
Patient temperature: 36
Potassium: 3.9 mmol/L (ref 3.5–5.1)
Potassium: 3.6 mmol/L (ref 3.5–5.1)
Potassium: 3.9 mmol/L (ref 3.5–5.1)
Sodium: 140 mmol/L (ref 135–145)
Sodium: 141 mmol/L (ref 135–145)
Sodium: 140 mmol/L (ref 135–145)
TCO2: 27 mmol/L (ref 22–32)
TCO2: 25 mmol/L (ref 22–32)
TCO2: 24 mmol/L (ref 22–32)
pCO2 arterial: 37.9 mmHg (ref 32–48)
pCO2 arterial: 43.6 mmHg (ref 32–48)
pCO2 arterial: 42.8 mmHg (ref 32–48)
pH, Arterial: 7.441 (ref 7.35–7.45)
pH, Arterial: 7.352 (ref 7.35–7.45)
pH, Arterial: 7.335 — ABNORMAL LOW (ref 7.35–7.45)
pO2, Arterial: 251 mmHg — ABNORMAL HIGH (ref 83–108)
pO2, Arterial: 146 mmHg — ABNORMAL HIGH (ref 83–108)
pO2, Arterial: 196 mmHg — ABNORMAL HIGH (ref 83–108)

## 2023-04-22 LAB — CBC
HCT: 36.1 % (ref 36.0–46.0)
HCT: 33.2 % — ABNORMAL LOW (ref 36.0–46.0)
Hemoglobin: 12.5 g/dL (ref 12.0–15.0)
Hemoglobin: 11.1 g/dL — ABNORMAL LOW (ref 12.0–15.0)
MCH: 31.2 pg (ref 26.0–34.0)
MCH: 31.4 pg (ref 26.0–34.0)
MCHC: 34.6 g/dL (ref 30.0–36.0)
MCHC: 33.4 g/dL (ref 30.0–36.0)
MCV: 90 fL (ref 80.0–100.0)
MCV: 94.1 fL (ref 80.0–100.0)
Platelets: 118 10*3/uL — ABNORMAL LOW (ref 150–400)
Platelets: 121 10*3/uL — ABNORMAL LOW (ref 150–400)
RBC: 4.01 MIL/uL (ref 3.87–5.11)
RBC: 3.53 MIL/uL — ABNORMAL LOW (ref 3.87–5.11)
RDW: 13.5 % (ref 11.5–15.5)
RDW: 14.1 % (ref 11.5–15.5)
WBC: 16.1 10*3/uL — ABNORMAL HIGH (ref 4.0–10.5)
WBC: 22.5 10*3/uL — ABNORMAL HIGH (ref 4.0–10.5)
nRBC: 0 % (ref 0.0–0.2)
nRBC: 0.1 % (ref 0.0–0.2)

## 2023-04-22 LAB — BPAM RBC
Blood Product Expiration Date: 202408162359
Blood Product Expiration Date: 202408172359
Blood Product Expiration Date: 202408172359
ISSUE DATE / TIME: 202407230746
ISSUE DATE / TIME: 202407230746
Unit Type and Rh: 6200
Unit Type and Rh: 6200
Unit Type and Rh: 6200

## 2023-04-22 LAB — PROTIME-INR
INR: 1.2 (ref 0.8–1.2)
Prothrombin Time: 15.4 seconds — ABNORMAL HIGH (ref 11.4–15.2)

## 2023-04-22 LAB — TYPE AND SCREEN
ABO/RH(D): A POS
Antibody Screen: NEGATIVE
Unit division: 0
Unit division: 0
Unit division: 0

## 2023-04-22 LAB — COMPREHENSIVE METABOLIC PANEL
ALT: 12 U/L (ref 0–44)
AST: 22 U/L (ref 15–41)
Albumin: 3 g/dL — ABNORMAL LOW (ref 3.5–5.0)
Alkaline Phosphatase: 39 U/L (ref 38–126)
Anion gap: 13 (ref 5–15)
BUN: 22 mg/dL (ref 8–23)
CO2: 21 mmol/L — ABNORMAL LOW (ref 22–32)
Calcium: 7.7 mg/dL — ABNORMAL LOW (ref 8.9–10.3)
Chloride: 103 mmol/L (ref 98–111)
Creatinine, Ser: 1.7 mg/dL — ABNORMAL HIGH (ref 0.44–1.00)
GFR, Estimated: 33 mL/min — ABNORMAL LOW (ref >60)
Glucose, Bld: 195 mg/dL — ABNORMAL HIGH (ref 70–99)
Potassium: 4.9 mmol/L (ref 3.5–5.1)
Sodium: 137 mmol/L (ref 135–145)
Total Bilirubin: 0.7 mg/dL (ref 0.3–1.2)
Total Protein: 4.8 g/dL — ABNORMAL LOW (ref 6.5–8.1)

## 2023-04-22 LAB — POCT ACTIVATED CLOTTING TIME
Activated Clotting Time: 220 seconds
Activated Clotting Time: 311 seconds
Activated Clotting Time: 232 seconds
Activated Clotting Time: 269 seconds
Activated Clotting Time: 104 seconds

## 2023-04-22 LAB — BASIC METABOLIC PANEL
Anion gap: 8 (ref 5–15)
BUN: 26 mg/dL — ABNORMAL HIGH (ref 8–23)
CO2: 20 mmol/L — ABNORMAL LOW (ref 22–32)
Calcium: 7.1 mg/dL — ABNORMAL LOW (ref 8.9–10.3)
Chloride: 107 mmol/L (ref 98–111)
Creatinine, Ser: 1.87 mg/dL — ABNORMAL HIGH (ref 0.44–1.00)
GFR, Estimated: 29 mL/min — ABNORMAL LOW (ref >60)
Glucose, Bld: 111 mg/dL — ABNORMAL HIGH (ref 70–99)
Potassium: 5.2 mmol/L — ABNORMAL HIGH (ref 3.5–5.1)
Sodium: 135 mmol/L (ref 135–145)

## 2023-04-22 LAB — APTT: aPTT: 33 seconds (ref 24–36)

## 2023-04-22 LAB — MAGNESIUM: Magnesium: 1.3 mg/dL — ABNORMAL LOW (ref 1.7–2.4)

## 2023-04-22 LAB — AMYLASE: Amylase: 52 U/L (ref 28–100)

## 2023-04-22 MED ORDER — SODIUM CHLORIDE 0.9 % IV SOLN: INTRAVENOUS | Status: DC

## 2023-04-22 MED ORDER — PANTOPRAZOLE SODIUM 40 MG IV SOLR
40.0000 mg | Freq: Two times a day (BID) | INTRAVENOUS | Status: DC
  Administered 2023-04-22 – 2023-04-26 (×8): 40 mg via INTRAVENOUS
  Filled 2023-04-22 (×8): qty 10

## 2023-04-22 MED ORDER — SODIUM CHLORIDE 0.9 % IV BOLUS
500.0000 mL | Freq: Once | INTRAVENOUS | Status: AC
  Administered 2023-04-22: 500 mL via INTRAVENOUS

## 2023-04-22 MED ORDER — MAGNESIUM SULFATE 2 GM/50ML IV SOLN
2.0000 g | Freq: Every day | INTRAVENOUS | Status: AC | PRN
  Administered 2023-04-23: 2 g via INTRAVENOUS
  Filled 2023-04-22: qty 50

## 2023-04-22 MED ORDER — ALBUMIN HUMAN 5 % IV SOLN
12.5000 g | Freq: Once | INTRAVENOUS | Status: AC
  Administered 2023-04-22: 12.5 g via INTRAVENOUS
  Filled 2023-04-22: qty 250

## 2023-04-22 MED ORDER — ALBUMIN HUMAN 25 % IV SOLN
12.5000 g | Freq: Once | INTRAVENOUS | Status: AC
  Administered 2023-04-22: 12.5 g via INTRAVENOUS
  Filled 2023-04-22: qty 50

## 2023-04-22 MED ORDER — SODIUM CHLORIDE 0.9 % IV BOLUS
1000.0000 mL | Freq: Once | INTRAVENOUS | Status: AC
  Administered 2023-04-22: 1000 mL via INTRAVENOUS

## 2023-04-22 NOTE — Progress Notes (Addendum)
Inpatient Rehab Admissions:  Inpatient Rehab Consult received.  I met with patient at the bedside for rehabilitation assessment and to discuss goals and expectations of an inpatient rehab admission.  Discussed average length of stay, insurance authorization requirement, discharge home after completion of CIR. Pt acknowledged understanding. Pt interested in pursuing CIR. Pt gave permission to contact family. Called pt's daughter-in-law [place]. No one answered. Left a message; awaiting return call. Will continue to follow.  ADDENDUM 1440: Christi returned call. Discussed CIR goals and expectations. She acknowledged understanding. She is supportive of pt pursuing CIR. She confirmed that family and a neighbor will be able to provide 24/7 support for pt after discharge.   Signed: Wolfgang Phoenix, MS, CCC-SLP Admissions Coordinator [phone]

## 2023-04-22 NOTE — TOC Initial Note (Signed)
Transition of Care [facility]) - Initial/Assessment Note    Patient Details  Name: Jane Miller MRN: [id number] Date of Birth: 02/03/1956  Transition of [facility] CM/SW Contact:    Elliot Cousin, RN Phone Number: [phone] 04/22/2023, 11:22 AM  Clinical Narrative:   TOC CM spoke to pt's DIL and states pt was independent PTA. Pt was preoperatively arranged with [facility] for HH. DIL takes pt to her appts. Will continue to follow for dc needs.                 Expected Discharge Plan: Home w Home Health Services Barriers to Discharge: Continued Medical Work up   Patient Goals and CMS Choice   CMS Medicare.gov Compare Post Acute Care list provided to:: Patient Represenative (must comment) (DIL-[name]) Choice offered to / list presented to : NA      Expected Discharge Plan and Services   Discharge Planning Services: CM Consult Post Acute Care Choice: Home Health Living arrangements for the past 2 months: Single Family Home                           HH Arranged: RN, PT [facility] Agency: [facility]        Prior Living Arrangements/Services Living arrangements for the past 2 months: Single Family Home Lives with:: Adult Children   Do you feel safe going back to the place where you live?: Yes               Activities of Daily Living      Permission Sought/Granted Permission sought to share information with : Case Manager, Family Supports Permission granted to share information with : Yes, Verbal Permission Granted  Share Information with NAME: [name]     Permission granted to share info w Relationship: daughter  Permission granted to share info w Contact Information: [phone]  Emotional Assessment Appearance:: Appears stated age Attitude/Demeanor/Rapport: Lethargic          Admission diagnosis:  S/P aortobifemoral bypass surgery [Z95.828] Aortic occlusion (HCC) [I70.0] Patient Active Problem List   Diagnosis Date Noted    S/P aortobifemoral bypass surgery 04/21/2023   Aortic occlusion (HCC) 04/21/2023   CKD (chronic kidney disease) stage 3, GFR 30-59 ml/min (HCC) 12/18/2022   Nocturnal leg cramps 11/26/2022   Leg pain, bilateral 11/26/2022   Tobacco abuse 11/26/2022   History of pneumonia 11/26/2022   Smoking 1/2 pack a day or less 02/23/2013   Routine general medical examination at a health care facility 02/23/2013   PCP:  Eloisa Northern, MD Pharmacy:   [facility] [address] [place], [place] - [address] AT [facility] [address] [place]. [place] [address] Phone: [phone] Fax: [phone]     Social Determinants of Health (SDOH) Social History: SDOH Screenings   Food Insecurity: No Food Insecurity (04/22/2023)  Housing: Low Risk  (04/22/2023)  Transportation Needs: No Transportation Needs (04/22/2023)  Utilities: Not At Risk (04/22/2023)  Tobacco Use: Medium Risk (04/21/2023)   SDOH Interventions: Food Insecurity Interventions: Intervention Not Indicated Housing Interventions: Intervention Not Indicated Transportation Interventions: Intervention Not Indicated Utilities Interventions: Intervention Not Indicated   Readmission Risk Interventions     No data to display

## 2023-04-22 NOTE — Progress Notes (Signed)
? ?  Inpatient Rehab Admissions Coordinator : ? ?Per therapy recommendations, patient was screened for CIR candidacy by Barbara Boyette RN MSN.  At this time patient appears to be a potential candidate for CIR. I will place a rehab consult per protocol for full assessment. Please call me with any questions. ? ?Barbara Boyette RN MSN ?Admissions Coordinator ?[phone] ?  ?

## 2023-04-22 NOTE — Evaluation (Signed)
Physical Therapy Evaluation Patient Details Name: Jane Miller MRN: [id number] DOB: Apr 22, 1956 Today's Date: 04/22/2023  History of Present Illness  Jane Miller is a 67 y.o. female admitted 04/21/23 with LE pain due to claudication with short distance ambulation.  She underwent aortobifemoral bypass with bilateral common femoral endarterectomies and ligation of inferior mesenteric vein and artery on 04/21/23. PMH positive for tobacco abuse and L heart cath 01/2012.  Clinical Impression  Patient presents with decreased mobility due to pain, decreased ROM, decreased strength and decreased activity tolerance.  Previously independent though living with family in multilevel home.  She was able to stand and step to recliner with +2 A today due to lines and significant L>R LE and abdominal pain and with hypotension.  She hopes to go to rehab prior to d/c home.  PT will continue to follow in the acute setting.         Assistance Recommended at Discharge Intermittent Supervision/Assistance  If plan is discharge home, recommend the following:  Can travel by private vehicle  A lot of help with walking and/or transfers;A lot of help with bathing/dressing/bathroom;Assist for transportation;Assistance with cooking/housework;Help with stairs or ramp for entrance        Equipment Recommendations Rolling walker (2 wheels);BSC/3in1  Recommendations for Other Services  Rehab consult    Functional Status Assessment Patient has had a recent decline in their functional status and demonstrates the ability to make significant improvements in function in a reasonable and predictable amount of time.     Precautions / Restrictions Precautions Precautions: Fall Precaution Comments: wound vac to abdomen, NGT to suction      Mobility  Bed Mobility Overal bed mobility: Needs Assistance Bed Mobility: Sidelying to Sit, Rolling Rolling: Mod assist Sidelying to sit: Mod assist, +2 for physical  assistance       General bed mobility comments: assist for rolling pt keeping blanket at abdomen to spint, +2 for legs off bed and lifting trunk due to pain    Transfers Overall transfer level: Needs assistance Equipment used: None, Ambulation equipment used Transfers: Sit to/from Stand Sit to Stand: Min assist, +2 physical assistance           General transfer comment: up from EOB taller than pt with assist for balance and lines pt keeping blanket at her abdomen throughout. stand step to recliner with one elbow on EVA walker, and assist to manage lines and walker to step around and chair brought up since c/o significant pain L LE with weight bearing    Ambulation/Gait               General Gait Details: unable due to pain  Stairs            Wheelchair Mobility     Tilt Bed    Modified Rankin (Stroke Patients Only)       Balance Overall balance assessment: Needs assistance Sitting-balance support: Feet supported Sitting balance-Leahy Scale: Fair Sitting balance - Comments: sitting with S while holding blanket at her abdomen   Standing balance support: Single extremity supported, During functional activity Standing balance-Leahy Scale: Poor Standing balance comment: A of 2 for safety with balance while stepping to recliner                             Pertinent Vitals/Pain Pain Assessment Pain Assessment: 0-10 Pain Score: 7  Pain Location: abdomen and legs esp L in standing Pain Descriptors /  Indicators: Tingling, Radiating, Grimacing, Guarding, Sore Pain Intervention(s): Monitored during session, Limited activity within patient's tolerance, Repositioned, Premedicated before session    Home Living Family/patient expects to be discharged to:: Private residence Living Arrangements: Children Available Help at Discharge: Family Type of Home: House Home Access: Stairs to enter Entrance Stairs-Rails: None   Alternate Level Stairs-Number of  Steps: 6 down to bedroom, has to traverse two sets up to shower only half bath downstairs Home Layout: Multi-level Home Equipment: None      Prior Function Prior Level of Function : Independent/Modified Independent             Mobility Comments: driving and completing IADL's independent       Hand Dominance        Extremity/Trunk Assessment   Upper Extremity Assessment Upper Extremity Assessment: Generalized weakness    Lower Extremity Assessment Lower Extremity Assessment: RLE deficits/detail;LLE deficits/detail RLE Deficits / Details: reports tingling in feet, noted knee and hip flexion appropriate for mobility though painful.  Strength knee extension at least 3/5 RLE Sensation: decreased light touch LLE Deficits / Details: reports tingling in feet, noted knee and hip flexion appropriate for mobility though painful. Strength knee extension at least 3/5 LLE Sensation: decreased light touch    Cervical / Trunk Assessment Cervical / Trunk Assessment: Other exceptions Cervical / Trunk Exceptions: abdominal incision  Communication   Communication: No difficulties  Cognition Arousal/Alertness: Awake/alert Behavior During Therapy: WFL for tasks assessed/performed Overall Cognitive Status: Within Functional Limits for tasks assessed                                          General Comments General comments (skin integrity, edema, etc.): SpO2 on 2L O2 90 or greater except when holding blanket tight to abdomen and poor wave form.  BP supine 88/62, in sitting 60/42, RN aware with arterial BP 120's/60's; once in chair back to 80's/50's; pt diaphoretic, but remained alert throughout    Exercises     Assessment/Plan    PT Assessment Patient needs continued PT services  PT Problem List         PT Treatment Interventions DME instruction;Balance training;Gait training;Functional mobility training;Patient/family education;Therapeutic exercise;Therapeutic  activities;Stair training    PT Goals (Current goals can be found in the Care Plan section)  Acute Rehab PT Goals Patient Stated Goal: to go to rehab prior to home PT Goal Formulation: With patient Time For Goal Achievement: 05/06/23 Potential to Achieve Goals: Good    Frequency Min 1X/week     Co-evaluation               AM-PAC PT "6 Clicks" Mobility  Outcome Measure Help needed turning from your back to your side while in a flat bed without using bedrails?: A Lot Help needed moving from lying on your back to sitting on the side of a flat bed without using bedrails?: Total Help needed moving to and from a bed to a chair (including a wheelchair)?: Total Help needed standing up from a chair using your arms (e.g., wheelchair or bedside chair)?: Total Help needed to walk in hospital room?: Total Help needed climbing 3-5 steps with a railing? : Total 6 Click Score: 7    End of Session Equipment Utilized During Treatment: Gait belt;Oxygen Activity Tolerance: Treatment limited secondary to medical complications (Comment);Patient limited by pain Patient left: in chair;with call bell/phone within reach;with  chair alarm set   PT Visit Diagnosis: Difficulty in walking, not elsewhere classified (R26.2);Muscle weakness (generalized) (M62.81);Pain Pain - Right/Left: Left Pain - part of body: Leg (and abodmen)    Time: 2683-4196 PT Time Calculation (min) (ACUTE ONLY): 34 min   Charges:   PT Evaluation $PT Eval Moderate Complexity: 1 Mod PT Treatments $Therapeutic Activity: 8-22 mins PT General Charges $$ ACUTE PT VISIT: 1 Visit         Sheran Lawless, PT Acute Rehabilitation Services Office:[phone] 04/22/2023   Elray Mcgregor 04/22/2023, 12:13 PM

## 2023-04-22 NOTE — Evaluation (Signed)
Occupational Therapy Evaluation Patient Details Name: Jane Miller MRN: [id number] DOB: 04-03-1956 Today's Date: 04/22/2023   History of Present Illness Jane Miller is a 67 y.o. female admitted 04/21/23 with LE pain due to claudication with short distance ambulation.  She underwent aortobifemoral bypass with bilateral common femoral endarterectomies and ligation of inferior mesenteric vein and artery on 04/21/23. PMH positive for tobacco abuse and L heart cath 01/2012.   Clinical Impression   Pt reports independence at baseline with ADLs and functional mobility, lives with grandchildren who can assist at d/c. Pt currently declines OOB mobility as she just returned to bed with [facility] staff, able to roll to L side with minA, mod-max A for ADLs. Pt provided with abdominal precautions handout and began education on compensatory strategies for ADLs/mobility, plan to practice in next session, pt verbalized understanding. Pt presenting with impairments listed below, will follow acutely. Patient will benefit from intensive inpatient follow up therapy, >3 hours/day to maximize safety/ind with ADLs/functional mobility prior to d/c home.      Recommendations for follow up therapy are one component of a multi-disciplinary discharge planning process, led by the attending physician.  Recommendations may be updated based on patient status, additional functional criteria and insurance authorization.   Assistance Recommended at Discharge Frequent or constant Supervision/Assistance  Patient can return home with the following A lot of help with walking and/or transfers;A lot of help with bathing/dressing/bathroom;Assistance with cooking/housework;Direct supervision/assist for medications management;Direct supervision/assist for financial management;Assist for transportation;Help with stairs or ramp for entrance    Functional Status Assessment  Patient has had a recent decline in their functional status and  demonstrates the ability to make significant improvements in function in a reasonable and predictable amount of time.  Equipment Recommendations  Tub/shower seat    Recommendations for Other Services Rehab consult;PT consult     Precautions / Restrictions Precautions Precautions: Fall Precaution Comments: NGT; abd precautions for comfort Restrictions Weight Bearing Restrictions: No      Mobility Bed Mobility Overal bed mobility: Needs Assistance   Rolling: Min assist         General bed mobility comments: rolls minimally to L side    Transfers                   General transfer comment: pt declined as pt just returned to bed with [facility] staff      Balance                                           ADL either performed or assessed with clinical judgement   ADL Overall ADL's : Needs assistance/impaired Eating/Feeding: NPO   Grooming: Set up;Sitting Grooming Details (indicate cue type and reason): demonstrates ROM for simulated grooming task Upper Body Bathing: Moderate assistance;Sitting;Bed level   Lower Body Bathing: Maximal assistance;Sitting/lateral leans;Bed level   Upper Body Dressing : Moderate assistance;Sitting;Bed level   Lower Body Dressing: Maximal assistance;Sitting/lateral leans;Bed level   Toilet Transfer: Moderate assistance   Toileting- Clothing Manipulation and Hygiene: Total assistance Toileting - Clothing Manipulation Details (indicate cue type and reason): catheter     Functional mobility during ADLs: Moderate assistance       Vision   Vision Assessment?: No apparent visual deficits     Perception Perception Perception Tested?: No   Praxis Praxis Praxis tested?: Not tested    Pertinent Vitals/Pain Pain Assessment  Pain Assessment: Faces Pain Score: 7  Faces Pain Scale: Hurts whole lot Pain Location: abdomen Pain Descriptors / Indicators: Tingling, Radiating, Grimacing, Guarding, Sore     Hand  Dominance Right   Extremity/Trunk Assessment Upper Extremity Assessment Upper Extremity Assessment: Generalized weakness   Lower Extremity Assessment Lower Extremity Assessment: Defer to PT evaluation RLE Deficits / Details: reports tingling in feet, noted knee and hip flexion appropriate for mobility though painful.  Strength knee extension at least 3/5 RLE Sensation: decreased light touch LLE Deficits / Details: reports tingling in feet, noted knee and hip flexion appropriate for mobility though painful. Strength knee extension at least 3/5 LLE Sensation: decreased light touch   Cervical / Trunk Assessment Cervical / Trunk Assessment: Other exceptions Cervical / Trunk Exceptions: abdominal incision   Communication Communication Communication: No difficulties   Cognition Arousal/Alertness: Awake/alert Behavior During Therapy: WFL for tasks assessed/performed Overall Cognitive Status: Within Functional Limits for tasks assessed                                       General Comments  VSS on supplemental O2    Exercises     Shoulder Instructions      Home Living Family/patient expects to be discharged to:: Private residence Living Arrangements: Children;Other (Comment) (grandchildren) Available Help at Discharge: Family Type of Home: House Home Access: Stairs to enter (also has ground level entry at back of house. It is a further distance for pt to travel.) Entrance Stairs-Number of Steps: 4 Entrance Stairs-Rails: None Home Layout: Multi-level Alternate Level Stairs-Number of Steps: 6 Alternate Level Stairs-Rails: Left Bathroom Shower/Tub: Tub/shower unit;Walk-in shower   Bathroom Toilet: Standard Bathroom Accessibility: Yes How Accessible: Accessible via walker Home Equipment: None      Lives With: Son;Family    Prior Functioning/Environment Prior Level of Function : Independent/Modified Independent             Mobility Comments: driving and  completing IADL's independent ADLs Comments: ind        OT Problem List: Decreased range of motion;Decreased strength;Decreased activity tolerance;Impaired balance (sitting and/or standing);Cardiopulmonary status limiting activity;Decreased knowledge of precautions;Decreased knowledge of use of DME or AE      OT Treatment/Interventions: Self-care/ADL training;Therapeutic exercise;Energy conservation;DME and/or AE instruction;Therapeutic activities;Patient/family education;Balance training    OT Goals(Current goals can be found in the care plan section) Acute Rehab OT Goals Patient Stated Goal: to rest OT Goal Formulation: With patient Time For Goal Achievement: 05/06/23 Potential to Achieve Goals: Good ADL Goals Pt Will Perform Upper Body Dressing: with min assist;sitting Pt Will Perform Lower Body Dressing: with min assist;with adaptive equipment;sitting/lateral leans;bed level;sit to/from stand Pt Will Transfer to Toilet: squat pivot transfer;stand pivot transfer;bedside commode;with min guard assist Additional ADL Goal #1: pt will perform bed mobility with min guard A in prep for ADLs  OT Frequency: Min 1X/week    Co-evaluation              AM-PAC OT "6 Clicks" Daily Activity     Outcome Measure Help from another person eating meals?:  (NPO) Help from another person taking care of personal grooming?: A Little Help from another person toileting, which includes using toliet, bedpan, or urinal?: A Lot Help from another person bathing (including washing, rinsing, drying)?: A Lot Help from another person to put on and taking off regular upper body clothing?: A Lot Help from another person to put  on and taking off regular lower body clothing?: A Lot 6 Click Score: 11   End of Session Equipment Utilized During Treatment: Oxygen Nurse Communication: Mobility status  Activity Tolerance: Patient limited by fatigue Patient left: in bed;with call bell/phone within reach;with bed  alarm set  OT Visit Diagnosis: Other abnormalities of gait and mobility (R26.89);Unsteadiness on feet (R26.81);Muscle weakness (generalized) (M62.81)                Time: 8657-8469 OT Time Calculation (min): 16 min Charges:  OT General Charges $OT Visit: 1 Visit OT Evaluation $OT Eval Low Complexity: 1 Low  Evangelene Vora K, OTD, OTR/L SecureChat Preferred Acute Rehab [phone]   Cordie Buening K Koonce 04/22/2023, 3:13 PM

## 2023-04-22 NOTE — Progress Notes (Addendum)
      Subjective  - Pain in abdomin incisional   Objective (!) 92/46 88 98 F (36.7 C) (Oral) 12 95%  Intake/Output Summary (Last 24 hours) at 04/22/2023 0701 Last data filed at 04/22/2023 0600 Gross per 24 hour  Intake 8893.06 ml  Output 1615 ml  Net 7278.06 ml   Palpable DP pulses B Abdomin soft, incision healing well with ecchymosis Lungs non labored breathing Groin soft dry 4 x 4 placed over incisions Heart RRR   Assessment/Planning: POD # 1 Aorto bifemoral bypass with 14 x 7 mm bifurcated dacron with bilateral common femoral endarterectomies and ligation of inferior mesenteric vein and artery.  This was done for aortic occlusion.  Urine OP 595 with Cr elevation 1.70.  IV fluids @ 125 ml /hr Dopamine support 3 mcg renal perfusion Afebrile with elevated WBC 16 Intraoperative acute blood loss treated with 4 units PRBC, 2  albumin.  HGB 12.5  Platelets show slight improvement 118 NG tube to suction 0 OP in canister Coags ordered APTT, PT/INR Mg replaced   INR stat ordered and give 500 cc NS bolus.    Mosetta Pigeon 04/22/2023 7:01 AM --  Laboratory Lab Results: Recent Labs    04/21/23 1339 04/21/23 1543 04/22/23 0313  WBC 16.2*  --  16.1*  HGB 13.5 13.3 12.5  HCT 39.6 39.0 36.1  PLT 116*  --  118*   BMET Recent Labs    04/21/23 1339 04/21/23 1543 04/22/23 0313  NA 140 139 137  K 4.4 4.9 4.9  CL 108  --  103  CO2 22  --  21*  GLUCOSE 189*  --  195*  BUN 15  --  22  CREATININE 1.14*  --  1.70*  CALCIUM 7.9*  --  7.7*    COAG Lab Results  Component Value Date   INR 1.7 (H) 04/21/2023   INR 1.1 04/13/2023   No results found for: "PTT"  I have independently interviewed and examined patient and agree with PA assessment and plan above.  Out of bed today having some hiccups and abdominal pain expected.  Will continue NG tube to low wall suction and increase Protonix to twice daily. She appears hemoconcentrated probably needs 1 L  normal saline and continue Foley catheter to monitor urine output.  Lab recheck this afternoon CBC and BMP.  Palpable pedal pulses bilaterally and feet are very warm neurologically intact.   Obed Samek C. Randie Heinz, MD [facility] Office: [phone] Pager: [phone]

## 2023-04-23 DIAGNOSIS — Z95828 Presence of other vascular implants and grafts: Secondary | ICD-10-CM

## 2023-04-23 LAB — CBC
HCT: 26.9 % — ABNORMAL LOW (ref 36.0–46.0)
MCH: 31.9 pg (ref 26.0–34.0)
MCHC: 33.5 g/dL (ref 30.0–36.0)
MCV: 95.4 fL (ref 80.0–100.0)
Platelets: 88 10*3/uL — ABNORMAL LOW (ref 150–400)
RBC: 2.82 MIL/uL — ABNORMAL LOW (ref 3.87–5.11)
RDW: 14.5 % (ref 11.5–15.5)
WBC: 13.8 10*3/uL — ABNORMAL HIGH (ref 4.0–10.5)
nRBC: 0 % (ref 0.0–0.2)

## 2023-04-23 LAB — BASIC METABOLIC PANEL
Anion gap: 7 (ref 5–15)
BUN: 27 mg/dL — ABNORMAL HIGH (ref 8–23)
CO2: 20 mmol/L — ABNORMAL LOW (ref 22–32)
Calcium: 7.1 mg/dL — ABNORMAL LOW (ref 8.9–10.3)
Chloride: 108 mmol/L (ref 98–111)
Creatinine, Ser: 1.72 mg/dL — ABNORMAL HIGH (ref 0.44–1.00)
GFR, Estimated: 32 mL/min — ABNORMAL LOW (ref >60)
Glucose, Bld: 110 mg/dL — ABNORMAL HIGH (ref 70–99)
Potassium: 4.9 mmol/L (ref 3.5–5.1)

## 2023-04-23 LAB — HEMOGLOBIN A1C
Hgb A1c MFr Bld: 5.4 % (ref 4.8–5.6)
Mean Plasma Glucose: 108.28 mg/dL

## 2023-04-23 LAB — MAGNESIUM: Magnesium: 1.6 mg/dL — ABNORMAL LOW (ref 1.7–2.4)

## 2023-04-23 MED ORDER — SODIUM CHLORIDE 0.9 % IV BOLUS
500.0000 mL | Freq: Once | INTRAVENOUS | Status: AC
  Administered 2023-04-23: 500 mL via INTRAVENOUS

## 2023-04-23 MED ORDER — MAGNESIUM SULFATE 2 GM/50ML IV SOLN
2.0000 g | Freq: Once | INTRAVENOUS | Status: AC
  Administered 2023-04-23: 2 g via INTRAVENOUS
  Filled 2023-04-23: qty 50

## 2023-04-23 MED ORDER — MAGNESIUM SULFATE 4 GM/100ML IV SOLN: 4.0000 g | Freq: Once | INTRAVENOUS | Status: DC

## 2023-04-23 NOTE — Progress Notes (Signed)
Occupational Therapy Treatment Patient Details Name: Jane Miller MRN: [id number] DOB: 1956-06-22 Today's Date: 04/23/2023   History of present illness Jane Miller is a 67 y.o. female admitted 04/21/23 with LE pain due to claudication with short distance ambulation.  She underwent aortobifemoral bypass with bilateral common femoral endarterectomies and ligation of inferior mesenteric vein and artery on 04/21/23. PMH positive for tobacco abuse and L heart cath 01/2012.   OT comments  Pt with slow progression towards goals, limited by L knee pain/buckling. Pt needing mod A for stand pivot transfer to chair, cues for hand placement as pt attempting to transfer without RW despite L knee buckling with first stand. Reviewed abdominal prec and bracing with pillow during movement as needed. Pt presenting with impairments listed below, will follow acutely. Patient will benefit from intensive inpatient follow up therapy, >3 hours/day to maximize safety/ind with ADLs/functional mobility.    Recommendations for follow up therapy are one component of a multi-disciplinary discharge planning process, led by the attending physician.  Recommendations may be updated based on patient status, additional functional criteria and insurance authorization.    Assistance Recommended at Discharge Frequent or constant Supervision/Assistance  Patient can return home with the following  A lot of help with walking and/or transfers;A lot of help with bathing/dressing/bathroom;Assistance with cooking/housework;Direct supervision/assist for medications management;Direct supervision/assist for financial management;Assist for transportation;Help with stairs or ramp for entrance   Equipment Recommendations  Tub/shower seat    Recommendations for Other Services Rehab consult;PT consult    Precautions / Restrictions Precautions Precautions: Fall Precaution Comments: NGT; abd precautions for comfort, L knee  buckling Restrictions Weight Bearing Restrictions: No       Mobility Bed Mobility Overal bed mobility: Needs Assistance Bed Mobility: Sit to Sidelying Rolling: Min assist       Sit to sidelying: Mod assist General bed mobility comments: mod A to assist legs into bed    Transfers Overall transfer level: Needs assistance Equipment used:  (eva walker) Transfers: Sit to/from Stand, Bed to chair/wheelchair/BSC Sit to Stand: Min assist     Step pivot transfers: Min assist     General transfer comment: cues for hand placement     Balance Overall balance assessment: Needs assistance Sitting-balance support: No upper extremity supported, Feet supported Sitting balance-Leahy Scale: Fair     Standing balance support: Bilateral upper extremity supported Standing balance-Leahy Scale: Poor Standing balance comment: reliant on external support                           ADL either performed or assessed with clinical judgement   ADL Overall ADL's : Needs assistance/impaired Eating/Feeding: NPO   Grooming: Set up;Sitting                   Toilet Transfer: Moderate assistance;Minimal assistance;Stand-pivot (eva walker)           Functional mobility during ADLs: Moderate assistance      Extremity/Trunk Assessment Upper Extremity Assessment Upper Extremity Assessment: Generalized weakness   Lower Extremity Assessment Lower Extremity Assessment: Defer to PT evaluation        Vision   Vision Assessment?: No apparent visual deficits   Perception Perception Perception: Not tested   Praxis Praxis Praxis: Not tested    Cognition Arousal/Alertness: Awake/alert Behavior During Therapy: WFL for tasks assessed/performed Overall Cognitive Status: Within Functional Limits for tasks assessed  Exercises      Shoulder Instructions       General Comments VSS    Pertinent Vitals/ Pain        Pain Assessment Pain Assessment: Faces Pain Score: 9  Faces Pain Scale: Hurts whole lot Pain Location: lt knee Pain Descriptors / Indicators: Stabbing, Guarding, Grimacing Pain Intervention(s): Limited activity within patient's tolerance, Monitored during session, Repositioned  Home Living                                          Prior Functioning/Environment              Frequency  Min 1X/week        Progress Toward Goals  OT Goals(current goals can now be found in the care plan section)  Progress towards OT goals: Progressing toward goals  Acute Rehab OT Goals Patient Stated Goal: none stated OT Goal Formulation: With patient Time For Goal Achievement: 05/06/23 Potential to Achieve Goals: Good ADL Goals Pt Will Perform Upper Body Dressing: with min assist;sitting Pt Will Perform Lower Body Dressing: with min assist;with adaptive equipment;sitting/lateral leans;bed level;sit to/from stand Pt Will Transfer to Toilet: squat pivot transfer;stand pivot transfer;bedside commode;with min guard assist Additional ADL Goal #1: pt will perform bed mobility with min guard A in prep for ADLs  Plan Discharge plan remains appropriate    Co-evaluation                 AM-PAC OT "6 Clicks" Daily Activity     Outcome Measure   Help from another person eating meals?:  (NPO) Help from another person taking care of personal grooming?: A Little Help from another person toileting, which includes using toliet, bedpan, or urinal?: A Lot Help from another person bathing (including washing, rinsing, drying)?: A Lot Help from another person to put on and taking off regular upper body clothing?: A Lot Help from another person to put on and taking off regular lower body clothing?: Total 6 Click Score: 10    End of Session Equipment Utilized During Treatment: Oxygen  OT Visit Diagnosis: Other abnormalities of gait and mobility (R26.89);Unsteadiness on feet  (R26.81);Muscle weakness (generalized) (M62.81)   Activity Tolerance Patient limited by fatigue   Patient Left in bed;with call bell/phone within reach;with bed alarm set;with nursing/sitter in room   Nurse Communication Mobility status        Time: 1610-9604 OT Time Calculation (min): 27 min  Charges: OT General Charges $OT Visit: 1 Visit OT Treatments $Therapeutic Activity: 23-37 mins  Carver Fila, OTD, OTR/L SecureChat Preferred Acute Rehab [phone]   Carver Fila Koonce 04/23/2023, 12:58 PM

## 2023-04-23 NOTE — Progress Notes (Signed)
Physical Therapy Treatment Patient Details Name: Jane Miller MRN: [id number] DOB: [birth year] Today's Date: 04/23/2023   History of Present Illness Jane Miller is a 67 y.o. female admitted 04/21/23 with LE pain due to claudication with short distance ambulation.  She underwent aortobifemoral bypass with bilateral common femoral endarterectomies and ligation of inferior mesenteric vein and artery on 04/21/23. PMH positive for tobacco abuse and L heart cath 01/2012.    PT Comments  Pt making steady progress with mobility. Limited amb distance due to stinging pain in lt medial knee. Currently recommending intensive inpatient follow up therapy, >3 hours/day but if pt progresses rapidly she may be able to bypass that.       Assistance Recommended at Discharge Intermittent Supervision/Assistance  If plan is discharge home, recommend the following:  Can travel by private vehicle    A little help with walking and/or transfers;A little help with bathing/dressing/bathroom;Assistance with cooking/housework;Help with stairs or ramp for entrance;Assist for transportation      Equipment Recommendations  Rollator (4 wheels)    Recommendations for Other Services       Precautions / Restrictions Precautions Precautions: Fall Precaution Comments: NGT; abd precautions for comfort Restrictions Weight Bearing Restrictions: No     Mobility  Bed Mobility Overal bed mobility: Needs Assistance   Rolling: Min assist Sidelying to sit: Min assist       General bed mobility comments: Assist to elevate trunk into sitting and bring hips to EOB    Transfers Overall transfer level: Needs assistance Equipment used: None, Rollator (4 wheels) Transfers: Sit to/from Stand, Bed to chair/wheelchair/BSC Sit to Stand: Min assist   Step pivot transfers: Min assist       General transfer comment: Assist to power up and stabilize. Bed to bsc with min assist for stability     Ambulation/Gait Ambulation/Gait assistance: Min guard Gait Distance (Feet): 60 Feet Assistive device: Rollator (4 wheels) Gait Pattern/deviations: Step-through pattern, Decreased stride length, Trunk flexed Gait velocity: decr Gait velocity interpretation: <1.31 ft/sec, indicative of household ambulator   General Gait Details: Pt flexed forward with forearms on handles of rollator. Verbal cues to stand more erect but pt reports flexing forward reduces pain in lt knee   Stairs             Wheelchair Mobility     Tilt Bed    Modified Rankin (Stroke Patients Only)       Balance Overall balance assessment: Needs assistance Sitting-balance support: No upper extremity supported, Feet supported Sitting balance-Leahy Scale: Fair     Standing balance support: Single extremity supported, Bilateral upper extremity supported, During functional activity Standing balance-Leahy Scale: Poor Standing balance comment: UE support and min guard for static standing                            Cognition Arousal/Alertness: Awake/alert Behavior During Therapy: WFL for tasks assessed/performed Overall Cognitive Status: Within Functional Limits for tasks assessed                                          Exercises      General Comments General comments (skin integrity, edema, etc.): SpO2 95% at rest on O2. Removed O2 wiht SpO2 to 85% with transfer to bsc. Replaced O2 at 3L with return of SpO2 to 92%      Pertinent  Vitals/Pain Pain Assessment Pain Assessment: Faces Faces Pain Scale: Hurts whole lot Pain Location: lt knee Pain Descriptors / Indicators: Stabbing, Guarding, Grimacing Pain Intervention(s): Limited activity within patient's tolerance, Monitored during session, Repositioned    Home Living                          Prior Function            PT Goals (current goals can now be found in the care plan section) Progress towards PT  goals: Progressing toward goals    Frequency    Min 1X/week      PT Plan Current plan remains appropriate;Equipment recommendations need to be updated    Co-evaluation              AM-PAC PT "6 Clicks" Mobility   Outcome Measure  Help needed turning from your back to your side while in a flat bed without using bedrails?: A Little Help needed moving from lying on your back to sitting on the side of a flat bed without using bedrails?: A Little Help needed moving to and from a bed to a chair (including a wheelchair)?: A Little Help needed standing up from a chair using your arms (e.g., wheelchair or bedside chair)?: A Little Help needed to walk in Miller room?: A Little Help needed climbing 3-5 steps with a railing? : Total 6 Click Score: 16    End of Session Equipment Utilized During Treatment: Oxygen Activity Tolerance: Patient limited by pain Patient left: in chair;with call bell/phone within reach;with chair alarm set Nurse Communication: Mobility status PT Visit Diagnosis: Difficulty in walking, not elsewhere classified (R26.2);Muscle weakness (generalized) (M62.81);Pain Pain - Right/Left: Left Pain - part of body: Knee     Time: 0865-7846 PT Time Calculation (min) (ACUTE ONLY): 28 min  Charges:    $Gait Training: 8-22 mins PT General Charges $$ ACUTE PT VISIT: 1 Visit                     Jane Miller PT Acute Rehabilitation Services Office [phone]    Jane Miller [place] 04/23/2023, 9:26 AM

## 2023-04-23 NOTE — Anesthesia Postprocedure Evaluation (Signed)
Anesthesia Post Note  Patient: Jane Miller  Procedure(s) Performed: AORTOBIFEMORAL BYPASS GRAFT USING HEMASHIELD GOLD VASCULAR GRAFT (Abdomen)     Patient location during evaluation: PACU Anesthesia Type: General Level of consciousness: awake and alert Pain management: pain level controlled Vital Signs Assessment: post-procedure vital signs reviewed and stable Respiratory status: spontaneous breathing, nonlabored ventilation and respiratory function stable Cardiovascular status: blood pressure returned to baseline and stable Postop Assessment: no apparent nausea or vomiting Anesthetic complications: no   There were no known notable events for this encounter.  Last Vitals:  Vitals:   04/23/23 0600 04/23/23 0700  BP: (!) 104/55 (!) 105/51  Pulse: 97 98  Resp: (!) 9 11  Temp:    SpO2: 97% 98%    Last Pain:  Vitals:   04/23/23 0400  TempSrc: Oral  PainSc: Asleep                 Lowella Curb

## 2023-04-23 NOTE — Consult Note (Signed)
Physical Medicine and Rehabilitation Consult Reason for Consult:CIR/acute rehab Referring Physician: Dr Lemar Livings   HPI: Jane Miller is a 67 y.o.  R handed female with hx of PVD, PAD, and active cigarette smoker- as well as L heart cath in 2013; she was admitted for planned surgery due to lack of blood flow to legs B/L.  She underwent B/L fempops/femoral endarterectomies- 04/21/23- by Dr Randie Heinz.  Currently- she's NPO when I saw pt- now clear liquid diet-  She had NGT in place to suction- with black drainage- ~ 300cc in cannister;  Her Ca 7.1 and Cr up to 1.71- BUN 27 up from 12- her plts down to 88k from 120 and WBC down to 13.8 from 22k  She was describing a sharp/lightnining/ icepick stabbing on L medial knee with standing/weight bearing.  She rates as 10/10 with walking; cannot put any pressure eon LLE.  "Every step hurts like...".  She's on 1L O2- but "sees no reason for it".  She also reports very poor sleep and says she's slept 6 hours in last 2 days.  She reports hardly any pain in her abdomen- her nose from NGT and knee are the worst.  She has a foley per Vascular.    ROS an entire ROS was completed and negative except for HPI Past Medical History:  Diagnosis Date   AAA (abdominal aortic aneurysm) (HCC)    Atherosclerosis of native artery of both lower extremities (HCC)    Hyperlipidemia    Leg pain    Nocturnal leg cramps    Past Surgical History:  Procedure Laterality Date   AORTA - BILATERAL FEMORAL ARTERY BYPASS GRAFT N/A 04/21/2023   Procedure: AORTOBIFEMORAL BYPASS GRAFT USING HEMASHIELD GOLD VASCULAR GRAFT;  Surgeon: Maeola Harman, MD;  Location: [facility] OR;  Service: Vascular;  Laterality: N/A;   CESAREAN SECTION  1981   CESAREAN SECTION  1986   LEFT HEART CATHETERIZATION WITH CORONARY ANGIOGRAM N/A 02/20/2012   Procedure: LEFT HEART CATHETERIZATION WITH CORONARY ANGIOGRAM;  Surgeon: Donato Schultz, MD;  Location: [facility] CATH LAB;  Service:  Cardiovascular;  Laterality: N/A;   TONSILLECTOMY     As a child   TUBAL LIGATION  1986   Family History  Problem Relation Age of Onset   COPD Mother    Heart attack Mother    Peripheral Artery Disease Father    Social History:  reports that she quit smoking about 4 months ago. Her smoking use included cigarettes. She does not have any smokeless tobacco history on file. She reports that she does not drink alcohol and does not use drugs. Allergies:  Allergies  Allergen Reactions   Codeine Nausea And Vomiting    After 1-2 doses   Prednisone Nausea And Vomiting   Medications Prior to Admission  Medication Sig Dispense Refill   aspirin EC 81 MG tablet Take 81 mg by mouth daily. Swallow whole.     Cholecalciferol 50 MCG (2000 UT) TABS Take 2,000 Units by mouth daily.     COLLAGEN PO Take 3 Scoops by mouth daily.     Multiple Minerals-Vitamins (CALCIUM-MAGNESIUM-ZINC-D3) TABS Take 1 tablet by mouth daily.     Multiple Vitamin (MULTIVITAMIN) capsule Take 1 capsule by mouth daily.     rosuvastatin (CRESTOR) 10 MG tablet Take 1 tablet (10 mg total) by mouth daily. 90 tablet 3    Home: Home Living Family/patient expects to be discharged to:: Private residence Living Arrangements: Children, Other (Comment) (grandchildren) Available Help  at Discharge: Family Type of Home: House Home Access: Stairs to enter (also has ground level entry at back of house. It is a further distance for pt to travel.) Entrance Stairs-Number of Steps: 4 Entrance Stairs-Rails: None Home Layout: Multi-level Alternate Level Stairs-Number of Steps: 6 Alternate Level Stairs-Rails: Left Bathroom Shower/Tub: Tub/shower unit, Health visitor: Standard Bathroom Accessibility: Yes Home Equipment: None  Lives With: Son, Family  Functional History: Prior Function Prior Level of Function : Independent/Modified Independent Mobility Comments: driving and completing IADL's independent ADLs Comments:  ind Functional Status:  Mobility: Bed Mobility Overal bed mobility: Needs Assistance Bed Mobility: Sit to Sidelying Rolling: Min assist Sidelying to sit: Min assist Sit to sidelying: Mod assist General bed mobility comments: mod A to assist legs into bed Transfers Overall transfer level: Needs assistance Equipment used:  (eva walker) Transfers: Sit to/from Stand, Bed to chair/wheelchair/BSC Sit to Stand: Min assist Bed to/from chair/wheelchair/BSC transfer type:: Step pivot Step pivot transfers: Min assist General transfer comment: cues for hand placement Ambulation/Gait Ambulation/Gait assistance: Min guard Gait Distance (Feet): 60 Feet Assistive device: Rollator (4 wheels) Gait Pattern/deviations: Step-through pattern, Decreased stride length, Trunk flexed General Gait Details: Pt flexed forward with forearms on handles of rollator. Verbal cues to stand more erect but pt reports flexing forward reduces pain in lt knee Gait velocity: decr Gait velocity interpretation: <1.31 ft/sec, indicative of household ambulator    ADL: ADL Overall ADL's : Needs assistance/impaired Eating/Feeding: NPO Grooming: Set up, Sitting Grooming Details (indicate cue type and reason): demonstrates ROM for simulated grooming task Upper Body Bathing: Moderate assistance, Sitting, Bed level Lower Body Bathing: Maximal assistance, Sitting/lateral leans, Bed level Upper Body Dressing : Moderate assistance, Sitting, Bed level Lower Body Dressing: Maximal assistance, Sitting/lateral leans, Bed level Toilet Transfer: Moderate assistance, Minimal assistance, Stand-pivot (eva walker) Toileting- Clothing Manipulation and Hygiene: Total assistance Toileting - Clothing Manipulation Details (indicate cue type and reason): catheter Functional mobility during ADLs: Moderate assistance  Cognition: Cognition Overall Cognitive Status: Within Functional Limits for tasks assessed Orientation Level: Oriented  X4 Cognition Arousal/Alertness: Awake/alert Behavior During Therapy: WFL for tasks assessed/performed Overall Cognitive Status: Within Functional Limits for tasks assessed  Blood pressure (!) 92/59, pulse 90, temperature 98.3 F (36.8 C), temperature source Oral, resp. rate 13, height 4\' 11"  (1.499 m), weight 68 kg, SpO2 98%. Physical Exam Vitals and nursing note reviewed.  Constitutional:      Appearance: She is obese.     Comments: Pt appears post op- supine in bed- awake ,alert, NAD  HENT:     Head: Normocephalic and atraumatic.     Right Ear: External ear normal.     Left Ear: External ear normal.     Nose: No congestion.     Comments: NGT drainging black drainage into cannister- ~ 300cc in cannister    Mouth/Throat:     Mouth: Mucous membranes are dry.     Pharynx: Oropharynx is clear. No oropharyngeal exudate.  Eyes:     General:        Right eye: No discharge.        Left eye: No discharge.     Extraocular Movements: Extraocular movements intact.  Cardiovascular:     Rate and Rhythm: Normal rate and regular rhythm.     Heart sounds: Normal heart sounds. No murmur heard.    No gallop.  Pulmonary:     Comments: A few lower lobe wheezes- otherwise clear- no R/R Abdominal:     Comments: Abd  bruised- with incisions intact-  Soft, hypoactive- mildly TTP- not distended  Genitourinary:    Comments: Foley- medium amber urine Musculoskeletal:     Cervical back: Neck supple. No tenderness.     Comments: Ue's and LE's 5-/5- pretty strong- but poor endurance right now  Skin:    General: Skin is warm and dry.     Comments: A lot of UE bruising, esp L dorsum of hand- black bruise 2 UE IV's- look OK   Neurological:     General: No focal deficit present.     Mental Status: She is alert and oriented to person, place, and time.     Comments: Pt says inctact to light touch in all 4 extremities- not positive due to lack of blood flow she had- think chronic reduction in sensation   Psychiatric:     Comments: Frustrated and a little grumpy over lack of sleep     Results for orders placed or performed during the hospital encounter of 04/21/23 (from the past 24 hour(s))  Hemoglobin A1c     Status: None   Collection Time: 04/23/23  2:38 AM  Result Value Ref Range   Hgb A1c MFr Bld 5.4 4.8 - 5.6 %   Mean Plasma Glucose 108.28 mg/dL  CBC     Status: Abnormal   Collection Time: 04/23/23  2:47 AM  Result Value Ref Range   WBC 13.8 (H) 4.0 - 10.5 K/uL   RBC 2.82 (L) 3.87 - 5.11 MIL/uL   Hemoglobin 9.0 (L) 12.0 - 15.0 g/dL   HCT 40.9 (L) 81.1 - 91.4 %   MCV 95.4 80.0 - 100.0 fL   MCH 31.9 26.0 - 34.0 pg   MCHC 33.5 30.0 - 36.0 g/dL   RDW 78.2 95.6 - 21.3 %   Platelets 88 (L) 150 - 400 K/uL   nRBC 0.0 0.0 - 0.2 %  Basic metabolic panel     Status: Abnormal   Collection Time: 04/23/23  2:47 AM  Result Value Ref Range   Sodium 135 135 - 145 mmol/L   Potassium 4.9 3.5 - 5.1 mmol/L   Chloride 108 98 - 111 mmol/L   CO2 20 (L) 22 - 32 mmol/L   Glucose, Bld 110 (H) 70 - 99 mg/dL   BUN 27 (H) 8 - 23 mg/dL   Creatinine, Ser 0.86 (H) 0.44 - 1.00 mg/dL   Calcium 7.1 (L) 8.9 - 10.3 mg/dL   GFR, Estimated 32 (L) >60 mL/min   Anion gap 7 5 - 15  Magnesium     Status: Abnormal   Collection Time: 04/23/23  2:47 AM  Result Value Ref Range   Magnesium 1.6 (L) 1.7 - 2.4 mg/dL   No results found.   Assessment/Plan: Diagnosis: Bi fempop and femoral endarterectomies B/L  Does the need for close, 24 hr/day medical supervision in concert with the patient's rehab needs make it unreasonable for this patient to be served in a less intensive setting? Yes Co-Morbidities requiring supervision/potential complications: nerve pain; CAD, hypotension off dopamine; L knee pain; NGT- for suction- foley for urinary retnetion/measurement of output.  Due to bladder management, bowel management, safety, skin/wound care, disease management, medication administration, pain management, and patient  education, does the patient require 24 hr/day rehab nursing? Yes Does the patient require coordinated care of a physician, rehab nurse, therapy disciplines of PT and OT to address physical and functional deficits in the context of the above medical diagnosis(es)? Yes Addressing deficits in the following areas: balance,  endurance, locomotion, strength, transferring, bowel/bladder control, bathing, dressing, feeding, grooming, and toileting Can the patient actively participate in an intensive therapy program of at least 3 hrs of therapy per day at least 5 days per week? Yes The potential for patient to make measurable gains while on inpatient rehab is good Anticipated functional outcomes upon discharge from inpatient rehab are supervision  with PT, supervision with OT, n/a with SLP. Estimated rehab length of stay to reach the above functional goals is: 10-14 days Anticipated discharge destination: Home Overall Rehab/Functional Prognosis: good  RECOMMENDATIONS: This patient's condition is appropriate for continued rehabilitative care in the following setting: CIR Patient has agreed to participate in recommended program. Yes Note that insurance prior authorization may be required for reimbursement for recommended care.  Comment:  Pt c/o L  medial knee pain- pt insistent it be scanned, fyi Suggest Voltaren gel for L knee 4G QID Can also try Gabapentin 300 mg TID for nerve pain Pt not sleeping well and very frustrated about it- can try Trazodone 50-75 mg at bedtime to help her go back to sleep after vitals taken, etc.  Will submit for admission to CIR- the goal, per d/w with Dr Randie Heinz, is mid next week.  Thank you for this consult.   Genice Rouge, MD 04/23/2023    I spent a total of 64   minutes on total care today- >50% coordination of care- due to  D/w Dr Randie Heinz about plan as well as review of chart, labs and vitals- and seeing pt and typing up note as detailed above.

## 2023-04-23 NOTE — Progress Notes (Signed)
   04/23/23 1648  Vitals  BP (!) 88/41  MAP (mmHg) (!) 55  Pulse Rate 85  ECG Heart Rate 85  Resp 13  Oxygen Therapy  SpO2 100 %  Art Line  Arterial Line BP 117/44  Arterial Line MAP (mmHg) 67 mmHg  MEWS Score  MEWS Temp 0  MEWS Systolic 1  MEWS Pulse 0  MEWS RR 1  MEWS LOC 0  MEWS Score 2  MEWS Score Color Yellow    Soft cuff pressures, see new orders for bolus.

## 2023-04-23 NOTE — Progress Notes (Signed)
   04/23/23 0905  Charting Type  Charting Type Full Reassessment Changes Noted  Focused Reassessment No Changes Vascular  ECG Monitoring  ECG Heart Rate (!) 104  Vascular  Vascular (WDL) X  LLE Neurovascular Assessment  LLE Capillary Refill  Less than/equal to 3 seconds  LLE Color  Appropriate for ethnicity  LLE Temperature/Moisture  Warm;Dry  L Posterior Tibial Pulse +1  L Dorsalis Pedis Pulse +1     Patient reports pain, with ambulation to the inner thigh LLE. Primary team informed.

## 2023-04-23 NOTE — Progress Notes (Addendum)
      Subjective  - no new complaints.  She states she has passed gas and feels like she could have a BM.   Objective (!) 104/55 97 98.8 F (37.1 C) (Oral) (!) 9 97%  Intake/Output Summary (Last 24 hours) at 04/23/2023 1610 Last data filed at 04/23/2023 0600 Gross per 24 hour  Intake 3538.44 ml  Output 770 ml  Net 2768.44 ml    Palpable pedal pulse Groin incisions and abdominal incisions healing well Abd soft, no BS to auscultation Lungs non labored  Arterial line with "whip" will go by cuff pressures    Assessment/Planning: POD # 2 Aorto bifemoral bypass with 14 x 7 mm bifurcated dacron with bilateral common femoral endarterectomies and ligation of inferior mesenteric vein and artery.  This was done for aortic occlusion.    Pedal pulses with improved inflow  Cr improved, urine OP low 470 ml last 24 hours Pending hypotension tolerance she may need lasix her fluid level are + 9000 since surgery. IV fluids currently 125 ml/hr NS HGB decreased 11 now 9.0.  S/P 2 liters may be dilutional  Dopamine stopped 7/24, BP 104/55 NG OP zero possible d/c today, patient reports flatus  Addendum patient was ambulating in room with PT and has acute medial left knee pain.  No erythema or edema.  Pedal pulses are maintained.  Heating pack PRN and observation.    Jane Miller 04/23/2023 7:02 AM --  Laboratory Lab Results: Recent Labs    04/22/23 1500 04/23/23 0247  WBC 22.5* 13.8*  HGB 11.1* 9.0*  HCT 33.2* 26.9*  PLT 121* 88*   BMET Recent Labs    04/22/23 1500 04/23/23 0247  NA 135 135  K 5.2* 4.9  CL 107 108  CO2 20* 20*  GLUCOSE 111* 110*  BUN 26* 27*  CREATININE 1.87* 1.72*  CALCIUM 7.1* 7.1*    COAG Lab Results  Component Value Date   INR 1.2 04/22/2023   INR 1.7 (H) 04/21/2023   INR 1.1 04/13/2023   No results found for: "PTT"  I have independently interviewed and examined patient and agree with PA assessment and plan above.  Overall looks  to be improving today with increased urine output and creatinine downtrending.  Will keep Foley catheter 1 more day to monitor I's and O's and creatinine stable or improved tomorrow will consider gentle diuresis.  I have removed her NG tube given she is passing flatus and her abdomen is soft and will advance her to clear liquids but I cautioned her to go slow.  She is having what sounds like nerve type pain in the left knee area and will follow physical medicine recommendations.  She is okay for out of bed and could transfer out of the unit tonight if there is a bed requirement.  Ismelda Weatherman C. Randie Heinz, MD [facility] Office: [phone] Pager: [phone]

## 2023-04-23 NOTE — Progress Notes (Signed)
   04/23/23 1735  Vitals  BP (!) 85/72  MAP (mmHg) 78  Pulse Rate 92  ECG Heart Rate 91  Resp 16  Oxygen Therapy  SpO2 91 %  Art Line  Arterial Line BP 113/46  Arterial Line MAP (mmHg) 67 mmHg  MEWS Score  MEWS Temp 0  MEWS Systolic 1  MEWS Pulse 0  MEWS RR 0  MEWS LOC 0  MEWS Score 1  MEWS Score Color Green     Primary team at bedside.

## 2023-04-23 NOTE — Plan of Care (Signed)

## 2023-04-23 NOTE — Progress Notes (Signed)
   04/23/23 1559  Vitals  BP (!) 90/47  MAP (mmHg) (!) 60  Pulse Rate 86  ECG Heart Rate 86  Resp 11  Oxygen Therapy  SpO2 100 %  Art Line  Arterial Line BP 121/46  Arterial Line MAP (mmHg) 69 mmHg  MEWS Score  MEWS Temp 0  MEWS Systolic 1  MEWS Pulse 0  MEWS RR 1  MEWS LOC 0  MEWS Score 2  MEWS Score Color Yellow     BP reported to primary team. Patient is resting comfortably. Will continue to monitor.

## 2023-04-23 NOTE — Progress Notes (Addendum)
IP rehab admissions - I have asked rehab MD for a formal inpatient rehab consult.   I will follow up again tomorrow after rehab MD consult is completed.  [phone]

## 2023-04-24 LAB — MAGNESIUM: Magnesium: 2.9 mg/dL — ABNORMAL HIGH (ref 1.7–2.4)

## 2023-04-24 LAB — CBC
HCT: 24.3 % — ABNORMAL LOW (ref 36.0–46.0)
HCT: 29.1 % — ABNORMAL LOW (ref 36.0–46.0)
Hemoglobin: 7.7 g/dL — ABNORMAL LOW (ref 12.0–15.0)
Hemoglobin: 9.3 g/dL — ABNORMAL LOW (ref 12.0–15.0)
MCH: 30.7 pg (ref 26.0–34.0)
MCH: 29.2 pg (ref 26.0–34.0)
MCHC: 31.7 g/dL (ref 30.0–36.0)
MCHC: 32 g/dL (ref 30.0–36.0)
MCV: 96.8 fL (ref 80.0–100.0)
MCV: 91.2 fL (ref 80.0–100.0)
Platelets: 80 10*3/uL — ABNORMAL LOW (ref 150–400)
Platelets: 87 10*3/uL — ABNORMAL LOW (ref 150–400)
RBC: 2.51 MIL/uL — ABNORMAL LOW (ref 3.87–5.11)
RBC: 3.19 MIL/uL — ABNORMAL LOW (ref 3.87–5.11)
RDW: 14.7 % (ref 11.5–15.5)
RDW: 18 % — ABNORMAL HIGH (ref 11.5–15.5)
WBC: 10.4 10*3/uL (ref 4.0–10.5)
WBC: 10.6 10*3/uL — ABNORMAL HIGH (ref 4.0–10.5)
nRBC: 0 % (ref 0.0–0.2)
nRBC: 0 % (ref 0.0–0.2)

## 2023-04-24 LAB — PREPARE RBC (CROSSMATCH)

## 2023-04-24 MED ORDER — GUAIFENESIN-DM 100-10 MG/5ML PO SYRP: 15.0000 mL | ORAL_SOLUTION | ORAL | Status: DC | PRN

## 2023-04-24 MED ORDER — ACETAMINOPHEN 160 MG/5ML PO SOLN: 325.0000 mg | ORAL | Status: DC | PRN

## 2023-04-24 MED ORDER — DICLOFENAC SODIUM 1 % EX GEL
4.0000 g | Freq: Three times a day (TID) | CUTANEOUS | Status: DC
  Administered 2023-04-24 – 2023-04-27 (×14): 4 g via TOPICAL
  Filled 2023-04-24: qty 100

## 2023-04-24 MED ORDER — SODIUM CHLORIDE 0.9% IV SOLUTION: Freq: Once | INTRAVENOUS | Status: AC

## 2023-04-24 MED ORDER — GABAPENTIN 300 MG PO CAPS
300.0000 mg | ORAL_CAPSULE | Freq: Three times a day (TID) | ORAL | Status: DC
  Administered 2023-04-24 – 2023-04-27 (×10): 300 mg via ORAL
  Filled 2023-04-24 (×11): qty 1

## 2023-04-24 MED ORDER — ALUM & MAG HYDROXIDE-SIMETH 200-200-20 MG/5ML PO SUSP: 15.0000 mL | ORAL | Status: DC | PRN

## 2023-04-24 MED ORDER — MAGNESIUM SULFATE 2 GM/50ML IV SOLN: 2.0000 g | Freq: Once | INTRAVENOUS | Status: DC

## 2023-04-24 MED ORDER — FUROSEMIDE 10 MG/ML IJ SOLN
40.0000 mg | Freq: Every day | INTRAMUSCULAR | Status: DC
  Administered 2023-04-24 – 2023-04-27 (×4): 40 mg via INTRAVENOUS
  Filled 2023-04-24 (×4): qty 4

## 2023-04-24 MED ORDER — ACETAMINOPHEN 325 MG RE SUPP: 325.0000 mg | RECTAL | Status: DC | PRN

## 2023-04-24 NOTE — Progress Notes (Signed)
Physical Therapy Treatment Patient Details Name: Jane Miller MRN: [id number] DOB: 14-Nov-1955 Today's Date: 04/24/2023   History of Present Illness Jane Miller is a 67 y.o. female admitted 04/21/23 with LE pain due to claudication with short distance ambulation.  She underwent aortobifemoral bypass with bilateral common femoral endarterectomies and ligation of inferior mesenteric vein and artery on 04/21/23. PMH positive for tobacco abuse and L heart cath 01/2012.    PT Comments  Pt making steady progress with mobility. Pain still present in knee but less than yesterday so she is moving better.      Assistance Recommended at Discharge Intermittent Supervision/Assistance  If plan is discharge home, recommend the following:  Can travel by private vehicle    A little help with walking and/or transfers;A little help with bathing/dressing/bathroom;Assistance with cooking/housework;Help with stairs or ramp for entrance;Assist for transportation      Equipment Recommendations  Rollator (4 wheels)    Recommendations for Other Services       Precautions / Restrictions Precautions Precautions: Fall Precaution Comments: abd precautions for comfort Restrictions Weight Bearing Restrictions: No     Mobility  Bed Mobility Overal bed mobility: Needs Assistance Bed Mobility: Rolling, Sit to Supine Rolling: Min assist Sidelying to sit: Min assist   Sit to supine: Min assist   General bed mobility comments: Assist to elevate trunk into sitting and bring hips to EOB. Assist to bring legs back up into bed    Transfers Overall transfer level: Needs assistance Equipment used: Rollator (4 wheels) Transfers: Sit to/from Stand Sit to Stand: Min assist           General transfer comment: Assist to power up and stabilize.    Ambulation/Gait Ambulation/Gait assistance: Min guard Gait Distance (Feet): 130 Feet Assistive device: Rollator (4 wheels) Gait Pattern/deviations:  Step-through pattern, Decreased stride length, Trunk flexed Gait velocity: decr Gait velocity interpretation: <1.31 ft/sec, indicative of household ambulator   General Gait Details: Pt with forearms on handles of rollator due to height of rollator and comfort of pt.   Stairs             Wheelchair Mobility     Tilt Bed    Modified Rankin (Stroke Patients Only)       Balance Overall balance assessment: Needs assistance Sitting-balance support: No upper extremity supported, Feet supported Sitting balance-Leahy Scale: Fair     Standing balance support: Single extremity supported, Bilateral upper extremity supported, During functional activity Standing balance-Leahy Scale: Poor Standing balance comment: UE support and min guard for static standing                            Cognition Arousal/Alertness: Awake/alert Behavior During Therapy: WFL for tasks assessed/performed Overall Cognitive Status: Within Functional Limits for tasks assessed                                          Exercises      General Comments General comments (skin integrity, edema, etc.): VSS on RA      Pertinent Vitals/Pain Pain Assessment Pain Assessment: Faces Faces Pain Scale: Hurts little more Pain Location: lt knee Pain Descriptors / Indicators: Stabbing, Guarding, Grimacing Pain Intervention(s): Limited activity within patient's tolerance, Monitored during session    Home Living  Prior Function            PT Goals (current goals can now be found in the care plan section) Progress towards PT goals: Progressing toward goals    Frequency    Min 1X/week      PT Plan Current plan remains appropriate;Equipment recommendations need to be updated    Co-evaluation              AM-PAC PT "6 Clicks" Mobility   Outcome Measure  Help needed turning from your back to your side while in a flat bed without  using bedrails?: A Little Help needed moving from lying on your back to sitting on the side of a flat bed without using bedrails?: A Little Help needed moving to and from a bed to a chair (including a wheelchair)?: A Little Help needed standing up from a chair using your arms (e.g., wheelchair or bedside chair)?: A Little Help needed to walk in hospital room?: A Little Help needed climbing 3-5 steps with a railing? : Total 6 Click Score: 16    End of Session   Activity Tolerance: Patient tolerated treatment well Patient left: with call bell/phone within reach;in bed;with bed alarm set;with family/visitor present Nurse Communication: Mobility status PT Visit Diagnosis: Difficulty in walking, not elsewhere classified (R26.2);Muscle weakness (generalized) (M62.81);Pain Pain - Right/Left: Left Pain - part of body: Knee     Time: 0272-5366 PT Time Calculation (min) (ACUTE ONLY): 21 min  Charges:    $Gait Training: 8-22 mins PT General Charges $$ ACUTE PT VISIT: 1 Visit                     [facility] PT Acute Rehabilitation Services Office [phone]    Angelina Ok [facility] 04/24/2023, 6:54 PM

## 2023-04-24 NOTE — Progress Notes (Signed)
IP rehab admissions - Case opened with [facility] medicare to request acute inpatient rehab admission.  Will update all once we hear back from insurance carrier.  Call for questions.  [phone]

## 2023-04-24 NOTE — Progress Notes (Addendum)
      Subjective  - Rested better last night   Objective (!) 108/50 82 99.5 F (37.5 C) (Oral) 13 93%  Intake/Output Summary (Last 24 hours) at 04/24/2023 0715 Last data filed at 04/24/2023 0636 Gross per 24 hour  Intake 1438.17 ml  Output 1305 ml  Net 133.17 ml    Cuff moved to forearm and corollate with a line Abdomin soft, palpable pedal pulses Groin soft Lungs non labored breathing Now with B knee pain medially.  No erythema or edema   Assessment/Planning: POD # 3 Aorto bifemoral bypass with 14 x 7 mm bifurcated dacron with bilateral common femoral endarterectomies and ligation of inferior mesenteric vein and artery.  This was done for aortic occlusion.                Pedal pulses with improved inflow  HGB decreased now at 7.7.  There is no obvious source of bleeding with soft incisions B groins and abdomin.   Tolerated clears without N/V Urine OP improved > 1400 ml last 24 hours Knee pain may be neurogenic, I will discuss this with Dr. Randie Heinz.  She may benefit from Gabapentin and voltaren gel suggested by Dr. Berline Chough with CIR.    Will transfuse 1 unit PRBC, start lasix 40 every day for diuresis. Transfer to 4E    Mosetta Pigeon 04/24/2023 7:15 AM --  Laboratory Lab Results: Recent Labs    04/24/23 0106 04/24/23 0431  WBC 10.3 10.4  HGB 7.8* 7.7*  HCT 24.1* 24.3*  PLT 77* 80*   BMET Recent Labs    04/23/23 0247 04/24/23 0106  NA 135 139  K 4.9 4.9  CL 108 112*  CO2 20* 20*  GLUCOSE 110* 84  BUN 27* 27*  CREATININE 1.72* 1.39*  CALCIUM 7.1* 7.5*    COAG Lab Results  Component Value Date   INR 1.2 04/22/2023   INR 1.7 (H) 04/21/2023   INR 1.1 04/13/2023   No results found for: "PTT"   I have independently interviewed and examined patient and agree with PA assessment and plan above. She is well appearing and tolerating clears. Complains of knee pain which appears neurologic in nature.  Neurontin and Voltaren gel added for  neuropathic pain.  Palpable pedal pulses bilaterally.  She has been transfused 1 unit and she has begun to diurese significantly give a dose of Lasix.  Okay for transfer to floor and should be acceptable for rehab early to mid next week.  Rees Matura C. Randie Heinz, MD [facility] [place] Office: [phone] Pager: [phone]

## 2023-04-24 NOTE — Plan of Care (Signed)

## 2023-04-25 MED ORDER — HEPARIN SODIUM (PORCINE) 5000 UNIT/ML IJ SOLN
5000.0000 [IU] | Freq: Three times a day (TID) | INTRAMUSCULAR | Status: DC
  Administered 2023-04-25 – 2023-04-27 (×6): 5000 [IU] via SUBCUTANEOUS
  Filled 2023-04-25 (×6): qty 1

## 2023-04-25 NOTE — Plan of Care (Signed)
POC progressing.  

## 2023-04-25 NOTE — Progress Notes (Addendum)
  VASCULAR SURGERY ASSESSMENT & PLAN:   POD 4 AORTOBIFEMORAL BYPASS GRAFT: She has palpable pedal pulses.  GI NUTRITION: She is tolerating a regular diet and her bowels are working.  RENAL: She was a liter and a half negative yesterday.  Her creatinine is coming down and is now 1.28.  DC Foley.  Will hold off on Lasix today as her lungs sound fairly clear.  DVT PROPHYLAXIS: Will start subcu heparin.  DISPOSITION: The patient is awaiting word on inpatient rehab.  From our standpoint she will be ready to go whenever a bed is available.  SUBJECTIVE:   No specific complaints this morning.  PHYSICAL EXAM:   Vitals:   04/24/23 1508 04/24/23 2100 04/24/23 2317 04/25/23 0345  BP: (!) 117/44 (!) 136/58 (!) 117/51 (!) 118/59  Pulse: 88 91 87 84  Resp: 16 16 14 14   Temp: 97.7 F (36.5 C) 98.4 F (36.9 C) 98.4 F (36.9 C) 97.9 F (36.6 C)  TempSrc: Oral Oral Oral Oral  SpO2: 93% 94% 94% 90%  Weight:      Height:       Her incisions look fine. She has palpable dorsalis pedis pulses. Lungs are clear.  LABS:   Lab Results  Component Value Date   WBC 9.3 04/25/2023   HGB 9.0 (L) 04/25/2023   HCT 27.8 (L) 04/25/2023   MCV 90.3 04/25/2023   PLT 107 (L) 04/25/2023   Lab Results  Component Value Date   CREATININE 1.28 (H) 04/25/2023   Lab Results  Component Value Date   INR 1.2 04/22/2023    PROBLEM LIST:    Principal Problem:   S/P aortobifemoral bypass surgery Active Problems:   Aortic occlusion (HCC)   CURRENT MEDS:    Chlorhexidine Gluconate Cloth  6 each Topical Daily   diclofenac Sodium  4 g Topical TID AC & HS   furosemide  40 mg Intravenous Daily   gabapentin  300 mg Oral TID   pantoprazole  40 mg Intravenous BID    Jane Miller Office: [phone] 04/25/2023

## 2023-04-26 LAB — CBC
HCT: 27.6 % — ABNORMAL LOW (ref 36.0–46.0)
Hemoglobin: 8.9 g/dL — ABNORMAL LOW (ref 12.0–15.0)
MCH: 29.5 pg (ref 26.0–34.0)
MCHC: 32.2 g/dL (ref 30.0–36.0)
MCV: 91.4 fL (ref 80.0–100.0)
Platelets: 123 10*3/uL — ABNORMAL LOW (ref 150–400)
RBC: 3.02 MIL/uL — ABNORMAL LOW (ref 3.87–5.11)
RDW: 17.2 % — ABNORMAL HIGH (ref 11.5–15.5)
WBC: 8 10*3/uL (ref 4.0–10.5)
nRBC: 0 % (ref 0.0–0.2)

## 2023-04-26 LAB — BASIC METABOLIC PANEL WITH GFR
Anion gap: 7 (ref 5–15)
BUN: 35 mg/dL — ABNORMAL HIGH (ref 8–23)
CO2: 26 mmol/L (ref 22–32)
Calcium: 7.7 mg/dL — ABNORMAL LOW (ref 8.9–10.3)
Chloride: 106 mmol/L (ref 98–111)
Creatinine, Ser: 1.35 mg/dL — ABNORMAL HIGH (ref 0.44–1.00)
GFR, Estimated: 43 mL/min — ABNORMAL LOW (ref >60)
Glucose, Bld: 88 mg/dL (ref 70–99)
Potassium: 4.3 mmol/L (ref 3.5–5.1)
Sodium: 139 mmol/L (ref 135–145)

## 2023-04-26 LAB — POTASSIUM: Potassium: 4.1 mmol/L (ref 3.5–5.1)

## 2023-04-26 MED ORDER — PANTOPRAZOLE SODIUM 40 MG PO TBEC
40.0000 mg | DELAYED_RELEASE_TABLET | Freq: Every day | ORAL | Status: DC
  Administered 2023-04-27: 40 mg via ORAL
  Filled 2023-04-26: qty 1

## 2023-04-26 NOTE — Discharge Instructions (Signed)
 [facility] [place]  Discharge Instructions   Open Aortic Surgery  Please refer to the following instructions for your post-procedure care. Your surgeon or Physician Assistant will discuss any changes with you.  Activity  Avoid lifting more than eight pounds (a gallon of milk) until after your first post-operative visit. You are encouraged to walk as much as you can. You can slowly return to normal activities but must avoid strenuous activity and heavy lifting until your doctor tells you it's okay. Heavy lifting can hurt the incision and cause a hernia. Avoid activities such as vacuuming or swinging a golf club. It is normal to feel tired for several weeks after your surgery. Do not drive until your doctor gives the okay and you are no longer taking prescription pain medications. It is also normal to have difficulty with sleep habits, eating and bowl movements after surgery. These will go away with time.  Bathing/Showering  Shower daily after you go home. Do not soak in a bathtub, hot tub, or swim until the incision heals.  Incision Care  Shower every day. Clean your incision with mild soap and water. Pat the area dry with a clean towel. You do not need a bandage unless otherwise instructed. Do not apply any ointments or creams to your incision. You may have skin glue on your incision. Do not peel it off. It will come off on its own in about one week. If you have staples or sutures along your incision, they will be removed at your post op appointment.  If you have groin incisions, wash the groin wounds with soap and water daily and pat dry. (No tub bath-only shower)  Then put a dry gauze or washcloth in the groin to keep this area dry to help prevent wound infection.  Do this daily and as needed.  Do not use Vaseline or neosporin on your incisions.  Only use soap and water on your incisions and then protect and keep dry.  Diet  Resume your normal diet. There are no  special food restriction following this procedure. A low fat/low cholesterol diet is recommended for all patients with vascular disease. After your aortic surgery, it's normal to feel full faster than usual and to not feel as hungry as you normally would. You will probably lose weight initially following your surgery. It's best to eat small, frequent meals over the course of the day. Call the office if you find that you are unable to eat even small meals.   In order to heal from your surgery, it is CRITICAL to get adequate nutrition. Your body requires vitamins, minerals, and protein. Vegetables are the best source of vitamins and minerals. If you have pain, you may take over-the-counter pain reliever such as acetaminophen (Tylenol). If you were prescribed a stronger pain medication, please be aware these medication can cause nausea and constipation. Prevent nausea by taking the medication with a snack or meal. Avoid constipation by drinking plenty of fluids and eating foods with a high amount of fiber, such as fruits, vegetables and grains. Take 100mg of the over-the-counter stool softener Colace twice a day as needed to help with constipation. A laxative, such as Milk of Magnesia, may be recommended for you at this time. Do not take a laxative unless your surgeon or P.A. tells you it's OK.  Do not take Tylenol if you are taking stronger pain medications (such as Percocet).  Follow Up  Our office will schedule a follow up   appointment 2-3 weeks after discharge.  Please call us immediately for any of the following conditions    .     Severe or worsening pain in your legs or feet or in your abdomen back or chest. Increased pain, redness drainage (pus) from your incision site. Increased abdominal pain, bloating, nausea, vomiting, or persistent diarrhea. Fever of 101 degrees or higher. Swelling in your leg (s).  Reduce your risk of vascular disease  Stop smoking. If you would like help, call  [facility] at 1-800-QUIT-NOW (1-800-784-8669) or  at 336-586-4000. Manage your cholesterol Maintain a desired weight Control your diabetes Keep your blood pressure down  If you have any questions please call the office at [phone].   

## 2023-04-26 NOTE — Progress Notes (Addendum)
  Progress Note    04/26/2023 7:43 AM 5 Days Post-Op  Subjective:  Sitting up eating breakfast. Had BM this morning. still having pins and needles in her medial knees   Vitals:   04/25/23 2015 04/26/23 0012  BP: (!) 111/45 (!) 139/126  Pulse: 85 83  Resp: 18 13  Temp: 98.9 F (37.2 C) 98.4 F (36.9 C)  SpO2: 93% 94%   Physical Exam: Cardiac:  regular Lungs:  non labored Incisions:  B groin incisions and midline incision are all intact and well appearing Extremities:  well perfused and warm with palpable DP pulses bilaterally Abdomen:  soft, non tender, non distended Neurologic: alert and oriented  CBC    Component Value Date/Time   WBC 8.0 04/26/2023 0407   RBC 3.02 (L) 04/26/2023 0407   HGB 8.9 (L) 04/26/2023 0407   HGB 13.2 11/26/2022 0417   HCT 27.6 (L) 04/26/2023 0407   HCT 38.4 11/26/2022 0417   PLT 123 (L) 04/26/2023 0407   PLT 247 11/26/2022 0417   MCV 91.4 04/26/2023 0407   MCV 95 11/26/2022 0417   MCH 29.5 04/26/2023 0407   MCHC 32.2 04/26/2023 0407   RDW 17.2 (H) 04/26/2023 0407   RDW 13.3 11/26/2022 0417   LYMPHSABS 1.4 11/26/2022 0417   EOSABS 0.1 11/26/2022 0417   BASOSABS 0.1 11/26/2022 0417    BMET    Component Value Date/Time   NA 139 04/26/2023 0407   NA 143 11/26/2022 0417   K 4.3 04/26/2023 0407   CL 106 04/26/2023 0407   CO2 26 04/26/2023 0407   GLUCOSE 88 04/26/2023 0407   BUN 35 (H) 04/26/2023 0407   BUN 14 11/26/2022 0417   CREATININE 1.35 (H) 04/26/2023 0407   CREATININE 0.82 02/23/2013 1745   CALCIUM 7.7 (L) 04/26/2023 0407   GFRNONAA 43 (L) 04/26/2023 0407   GFRNONAA 80 02/23/2013 1745   GFRAA >89 02/23/2013 1745    INR    Component Value Date/Time   INR 1.2 04/22/2023 0817     Intake/Output Summary (Last 24 hours) at 04/26/2023 0743 Last data filed at 04/25/2023 1700 Gross per 24 hour  Intake --  Output 2175 ml  Net -2175 ml     Assessment/Plan:  67 y.o. female is s/p aortobifemoral bypass 5 Days Post-Op    BLE well perfused and warm with palpable pedal pulses Tolerating regular diet Bowels working Scr at baseline  H&H stable Continue IS. Can try to wean off of [place]. She is not on home O2 OOB to chair. Mobilize as tolerated  Dispo CIR when bed available   DVT prophylaxis: sq heparin   Graceann Congress, PA-C [facility] [phone] 04/26/2023 7:43 AM  I have interviewed the patient and examined the patient. I agree with the findings by the PA.  Tolerating a regular diet.  Her bowels are functioning fine.  She has palpable pedal pulses.  Awaiting placement.  Cari Caraway, MD

## 2023-04-26 NOTE — Plan of Care (Signed)
  Problem: Clinical Measurements: Goal: Will remain free from infection Outcome: Progressing Goal: Diagnostic test results will improve Outcome: Progressing Goal: Respiratory complications will improve Outcome: Progressing   Problem: Activity: Goal: Risk for activity intolerance will decrease Outcome: Progressing   

## 2023-04-26 NOTE — Progress Notes (Signed)
Central telemetry reported 21 beat of SVT and 2 PVCs. Tele findings and EKG results called to Dr. Edilia Bo. Order received. Will continue to monitor.

## 2023-04-27 ENCOUNTER — Other Ambulatory Visit (HOSPITAL_COMMUNITY): Payer: Self-pay

## 2023-04-27 MED ORDER — DICLOFENAC SODIUM 1 % EX GEL: 2.0000 g | Freq: Three times a day (TID) | CUTANEOUS | 0 refills | Status: DC

## 2023-04-27 MED ORDER — OXYCODONE HCL 5 MG PO TABS
5.0000 mg | ORAL_TABLET | ORAL | 0 refills | Status: DC | PRN
  Filled 2023-04-27: qty 20, 4d supply, fill #0

## 2023-04-27 MED ORDER — GABAPENTIN 300 MG PO CAPS: 300.0000 mg | ORAL_CAPSULE | Freq: Three times a day (TID) | ORAL | 0 refills | Status: DC

## 2023-04-27 NOTE — Progress Notes (Signed)
Occupational Therapy Treatment and Discharge Patient Details Name: Jane Miller MRN: [id number] DOB: 05/05/1956 Today's Date: 04/27/2023   History of present illness BAILY KNOPS is a 67 y.o. female admitted 04/21/23 with LE pain due to claudication with short distance ambulation.  She underwent aortobifemoral bypass with bilateral common femoral endarterectomies and ligation of inferior mesenteric vein and artery on 04/21/23. PMH positive for tobacco abuse and L heart cath 01/2012.   OT comments  Pt has been routinely ambulating to bathroom and sink without AD. She is functioning modified independently in basic self care. Her family will assist with IADLs as needed when she returns home. No further OT needs.    Recommendations for follow up therapy are one component of a multi-disciplinary discharge planning process, led by the attending physician.  Recommendations may be updated based on patient status, additional functional criteria and insurance authorization.    Assistance Recommended at Discharge Set up Supervision/Assistance  Patient can return home with the following  Help with stairs or ramp for entrance;Assist for transportation   Equipment Recommendations  None recommended by OT    Recommendations for Other Services      Precautions / Restrictions Precautions Precautions: Fall Precaution Comments: abd precautions for comfort Restrictions Weight Bearing Restrictions: No       Mobility Bed Mobility Overal bed mobility: Modified Independent                  Transfers Overall transfer level: Modified independent Equipment used: None                     Balance     Sitting balance-Leahy Scale: Good       Standing balance-Leahy Scale: Good                             ADL either performed or assessed with clinical judgement   ADL Overall ADL's : Modified independent                         Toilet Transfer:  Modified Independent   Toileting- Clothing Manipulation and Hygiene: Modified independent              Extremity/Trunk Assessment              Vision       Perception     Praxis      Cognition Arousal/Alertness: Awake/alert Behavior During Therapy: WFL for tasks assessed/performed Overall Cognitive Status: Within Functional Limits for tasks assessed                                          Exercises      Shoulder Instructions       General Comments      Pertinent Vitals/ Pain       Pain Assessment Pain Assessment: Faces Faces Pain Scale: Hurts a little bit Pain Location: knees Pain Descriptors / Indicators: Pins and needles Pain Intervention(s): Monitored during session  Home Living                                          Prior Functioning/Environment  Frequency           Progress Toward Goals  OT Goals(current goals can now be found in the care plan section)  Progress towards OT goals: Goals met/education completed, patient discharged from OT  Acute Rehab OT Goals OT Goal Formulation: With patient  Plan All goals met and education completed, patient discharged from OT services    Co-evaluation                 AM-PAC OT "6 Clicks" Daily Activity     Outcome Measure   Help from another person eating meals?: None Help from another person taking care of personal grooming?: None Help from another person toileting, which includes using toliet, bedpan, or urinal?: None Help from another person bathing (including washing, rinsing, drying)?: None Help from another person to put on and taking off regular upper body clothing?: None Help from another person to put on and taking off regular lower body clothing?: None 6 Click Score: 24    End of Session    OT Visit Diagnosis: Pain   Activity Tolerance Patient tolerated treatment well   Patient Left in bed;with call bell/phone within  reach;with bed alarm set;with nursing/sitter in room   Nurse Communication          Time: 1610-9604 OT Time Calculation (min): 40 min  Charges: OT General Charges $OT Visit: 1 Visit OT Treatments $Self Care/Home Management : 38-52 mins  Berna Spare, OTR/L Acute Rehabilitation Services Office: [phone]   Evern Bio 04/27/2023, 9:39 AM

## 2023-04-27 NOTE — Progress Notes (Addendum)
Physical Therapy Treatment Patient Details Name: Jane Miller MRN: [id number] DOB: 1955/12/07 Today's Date: 04/27/2023   History of Present Illness Jane Miller is a 67 y.o. female admitted 04/21/23 with LE pain due to claudication with short distance ambulation.  She underwent aortobifemoral bypass with bilateral common femoral endarterectomies and ligation of inferior mesenteric vein and artery on 04/21/23. PMH positive for tobacco abuse and L heart cath 01/2012.    PT Comments  Pt continuing to make steady progress with functional mobility. She was able to progress to ambulating without an AD this session. She was on RA throughout with SpO2 at 100% at rest; however, with ambulation pt's SpO2 decreased to as low as 80% with rapid rebound to >90% with standing rest break and focusing on breathing. Pt would continue to benefit from skilled physical therapy services at this time while admitted and after d/c to address the below listed limitations in order to improve overall safety and independence with functional mobility.     If plan is discharge home, recommend the following: A little help with walking and/or transfers;A little help with bathing/dressing/bathroom;Assistance with cooking/housework;Help with stairs or ramp for entrance;Assist for transportation   Can travel by private Psychologist, clinical (4 wheels)    Recommendations for Other Services       Precautions / Restrictions Precautions Precautions: Fall Precaution Comments: abd precautions for comfort Restrictions Weight Bearing Restrictions: No     Mobility  Bed Mobility Overal bed mobility: Needs Assistance Bed Mobility: Sit to Supine       Sit to supine: Supervision        Transfers Overall transfer level: Needs assistance Equipment used: None Transfers: Sit to/from Stand Sit to Stand: Min guard           General transfer comment: min guard for safety with  transitional movement    Ambulation/Gait Ambulation/Gait assistance: Min guard Gait Distance (Feet): 150 Feet Assistive device: None Gait Pattern/deviations: Step-through pattern, Decreased stride length, Trunk flexed Gait velocity: decr     General Gait Details: pt able to ambulate in room and hallway without use of an AD with min guard from PT for safety. Pt with mild instability but no overt LOB or need for physical assistance   Stairs             Wheelchair Mobility     Tilt Bed    Modified Rankin (Stroke Patients Only)       Balance Overall balance assessment: Needs assistance Sitting-balance support: No upper extremity supported, Feet supported Sitting balance-Leahy Scale: Good     Standing balance support: During functional activity, No upper extremity supported Standing balance-Leahy Scale: Fair                              Cognition Arousal/Alertness: Awake/alert Behavior During Therapy: WFL for tasks assessed/performed Overall Cognitive Status: Within Functional Limits for tasks assessed                                          Exercises      General Comments        Pertinent Vitals/Pain Pain Assessment Pain Assessment: Faces Faces Pain Scale: Hurts little more Pain Location: L knee Pain Descriptors / Indicators: Stabbing, Guarding, Grimacing Pain Intervention(s): Monitored during session,  Repositioned, Patient requesting pain meds-RN notified    Home Living                          Prior Function            PT Goals (current goals can now be found in the care plan section) Acute Rehab PT Goals PT Goal Formulation: With patient Time For Goal Achievement: 05/06/23 Potential to Achieve Goals: Good Progress towards PT goals: Progressing toward goals    Frequency    Min 1X/week      PT Plan Discharge plan needs to be updated    Co-evaluation              AM-PAC PT "6 Clicks"  Mobility   Outcome Measure  Help needed turning from your back to your side while in a flat bed without using bedrails?: A Little Help needed moving from lying on your back to sitting on the side of a flat bed without using bedrails?: A Little Help needed moving to and from a bed to a chair (including a wheelchair)?: A Little Help needed standing up from a chair using your arms (e.g., wheelchair or bedside chair)?: A Little Help needed to walk in hospital room?: A Little Help needed climbing 3-5 steps with a railing? : Total 6 Click Score: 16    End of Session   Activity Tolerance: Patient tolerated treatment well Patient left: with call bell/phone within reach;in bed Nurse Communication: Mobility status PT Visit Diagnosis: Difficulty in walking, not elsewhere classified (R26.2);Muscle weakness (generalized) (M62.81);Pain Pain - Right/Left: Left Pain - part of body: Knee     Time: 8295-6213 PT Time Calculation (min) (ACUTE ONLY): 30 min  Charges:    $Gait Training: 8-22 mins $Therapeutic Activity: 8-22 mins PT General Charges $$ ACUTE PT VISIT: 1 Visit                     Arletta Bale, DPT  Acute Rehabilitation Services Office [phone]    Jane Miller 04/27/2023, 9:32 AM

## 2023-04-27 NOTE — Care Management Important Message (Signed)
Important Message  Patient Details  Name: Jane Miller MRN: [id number] Date of Birth: 08-Dec-1955   Medicare Important Message Given:  Yes     Renie Ora 04/27/2023, 9:13 AM

## 2023-04-27 NOTE — Progress Notes (Signed)
Mobility Specialist Progress Note:   04/27/23 1207  Mobility  Activity Ambulated independently in hallway  Level of Assistance Standby assist, set-up cues, supervision of patient - no hands on  Assistive Device None  Distance Ambulated (ft) 300 ft  Activity Response Tolerated well  Mobility Referral Yes  $Mobility charge 1 Mobility  Mobility Specialist Start Time (ACUTE ONLY) 1150  Mobility Specialist Stop Time (ACUTE ONLY) 1201  Mobility Specialist Time Calculation (min) (ACUTE ONLY) 11 min    Pre Mobility: 86 HR , 88% SpO2 RA  During Mobility: 105 HR  Post Mobility: 79 HR , 94% SpO2 RA  Pt received in bed, agreeable to mobility. C/o slight SOB during ambulation, denied any feelings of lightheadedness. Not able to get stable SpO2 readings during ambulation. Levels ranged from 79% - 98%. Ambulated on RA. Pursed lip breathing encouraged. Pt c/o slight abdominal discomfort and leg weakness, otherwise asymptomatic throughout. Pt returned to bed with call bell in reach and all needs met.    Leory Plowman  Mobility Specialist Please contact via [employer] office at [phone]

## 2023-04-27 NOTE — Progress Notes (Signed)
IP rehab admissions - I received message from PT/OT saying that patient did very well with therapies today and can likely go home with [facility] therapies.  I will not continue to seek CIR admission.  Call me for questions.  [phone]

## 2023-04-27 NOTE — Progress Notes (Addendum)
  Progress Note    04/27/2023 7:42 AM 6 Days Post-Op  Subjective:  has pins and needles at her medial knees when walking, but otherwise feeling fine   Vitals:   04/26/23 2311 04/27/23 0348  BP: 121/65 (!) 115/59  Pulse: 86 75  Resp: 15 13  Temp: 98.3 F (36.8 C) 97.7 F (36.5 C)  SpO2: 92% 100%    Physical Exam: Cardiac:  regular Lungs:  nonlabored Incisions:  bilateral groin and midline incisions dry and intact without signs of infection. Dry gauze placed in bilateral groins to wick moisture Extremities:  palpable DP pulses bilaterally Abdomen:  soft, NT, ND  CBC    Component Value Date/Time   WBC 8.3 04/27/2023 0309   RBC 3.05 (L) 04/27/2023 0309   HGB 9.4 (L) 04/27/2023 0309   HGB 13.2 11/26/2022 0417   HCT 29.2 (L) 04/27/2023 0309   HCT 38.4 11/26/2022 0417   PLT 138 (L) 04/27/2023 0309   PLT 247 11/26/2022 0417   MCV 95.7 04/27/2023 0309   MCV 95 11/26/2022 0417   MCH 30.8 04/27/2023 0309   MCHC 32.2 04/27/2023 0309   RDW 16.3 (H) 04/27/2023 0309   RDW 13.3 11/26/2022 0417   LYMPHSABS 1.4 11/26/2022 0417   EOSABS 0.1 11/26/2022 0417   BASOSABS 0.1 11/26/2022 0417    BMET    Component Value Date/Time   NA 139 04/27/2023 0309   NA 143 11/26/2022 0417   K 4.3 04/27/2023 0309   CL 103 04/27/2023 0309   CO2 27 04/27/2023 0309   GLUCOSE 97 04/27/2023 0309   BUN 37 (H) 04/27/2023 0309   BUN 14 11/26/2022 0417   CREATININE 1.45 (H) 04/27/2023 0309   CREATININE 0.82 02/23/2013 1745   CALCIUM 8.1 (L) 04/27/2023 0309   GFRNONAA 40 (L) 04/27/2023 0309   GFRNONAA 80 02/23/2013 1745   GFRAA >89 02/23/2013 1745    INR    Component Value Date/Time   INR 1.2 04/22/2023 0817     Intake/Output Summary (Last 24 hours) at 04/27/2023 0742 Last data filed at 04/26/2023 0930 Gross per 24 hour  Intake 472 ml  Output --  Net 472 ml      Assessment/Plan:  67 y.o. female is 6 days post op, s/p: aortobifemoral bypass   -Pain is well controlled and she is  mobilizing well. Continue to get OOB as tolerated -BLE warm and well perfused with palpable DP pulses -Scr at 1.45 this morning around baseline -O2 sats at 99 on RA, weaned off nasal cannula yesterday -Midline and bilateral groin incisions intact and dry. Please keep a dry gauze in bilateral groins to prevent breakdown -Awaiting CIR placement -DVT prophylaxis:  subcutaneous heparin   Loel Dubonnet, PA-C [facility] [phone] 04/27/2023 7:42 AM  I have interviewed the patient and examined the patient. I agree with the findings by the PA.  Occupational Therapy and physical therapy feel that it is safe for her to go home as she is doing very well.  We will arrange for home health physical therapy and try to discharge today.  Cari Caraway, MD

## 2023-04-28 DIAGNOSIS — Z95828 Presence of other vascular implants and grafts: Secondary | ICD-10-CM | POA: Diagnosis not present

## 2023-04-28 DIAGNOSIS — N183 Chronic kidney disease, stage 3 unspecified: Secondary | ICD-10-CM | POA: Diagnosis not present

## 2023-04-28 DIAGNOSIS — I700 Atherosclerosis of aorta: Secondary | ICD-10-CM | POA: Diagnosis not present

## 2023-04-28 NOTE — TOC Transition Note (Signed)
Transition of Care (TOC) - CM/SW Discharge Note Donn Pierini RN, BSN Transitions of Care Unit 4E- RN Case Manager See Treatment Team for direct phone #   Patient Details  Name: Jane Miller MRN: [id number] Date of Birth: Jan 02, 1956  Transition of Care [facility]) CM/SW Contact:  Darrold Span, RN Phone Number: 04/28/2023, 11:48 AM   Clinical Narrative:    Noted pt may transition home later today, DME order for rollator- call made to [employer] liaison to have DME delivered this afternoon.   Pt has [facility] office referral for [facility] needs- HHPT order has been placed. TOC to f/u and confirm.   Family will transport home.   7/30- post discharge f/u- call made to [facility] liaison to confirm service- Iantha Fallen has confirmed they will service pt for HHPT needs- to contact pt to schedule.    Final next level of care: Home w Home Health Services Barriers to Discharge: Barriers Resolved   Patient Goals and CMS Choice CMS Medicare.gov Compare Post Acute Care list provided to:: Patient Represenative (must comment) (DIL-[name]) Choice offered to / list presented to : NA  Discharge Placement               Home w/ [facility]          Discharge Plan and Services Additional resources added to the After Visit Summary for     Discharge Planning Services: CM Consult Post Acute Care Choice: IP Rehab          DME Arranged: Walker rolling with seat DME Agency: [employer] Date DME Agency Contacted: 04/27/23 Time DME Agency Contacted: 1500 Representative spoke with at DME Agency: Beckie Salts HH Arranged: RN, PT HH Agency: [facility] Date [facility] Agency Contacted: 04/28/23 Time HH Agency Contacted: 0900 Representative spoke with at [facility] Agency: Amy  Social Determinants of Health (SDOH) Interventions SDOH Screenings   Food Insecurity: No Food Insecurity (04/22/2023)  Housing: Low Risk  (04/22/2023)  Transportation Needs: No Transportation Needs (04/22/2023)  Utilities: Not At Risk (04/22/2023)   Tobacco Use: Medium Risk (04/21/2023)     Readmission Risk Interventions     No data to display

## 2023-04-29 ENCOUNTER — Telehealth: Payer: Self-pay

## 2023-04-29 MED FILL — Heparin Sodium (Porcine) Inj 1000 Unit/ML: INTRAMUSCULAR | Qty: 30 | Status: AC

## 2023-04-29 MED FILL — Sodium Chloride IV Soln 0.9%: INTRAVENOUS | Qty: 1000 | Status: AC

## 2023-04-29 MED FILL — Sodium Chloride Irrigation Soln 0.9%: Qty: 3000 | Status: AC

## 2023-04-29 NOTE — Telephone Encounter (Signed)
Pt called requesting clarification on meds.  Reviewed pt's chart, returned call for clarification, two identifiers used. She questioned the injections she was given during her hospital stay. Informed her that those were for DVT prophylaxis d/t decreased activity while hospitalized. She inquired if she needed to be on any other blood thinners.  Spoke with [employer], PA who stated that the ASA 81 mg was all she should have been prescribed.  Called pt to relay PA's information, no answer, lf vm stating her meds were correct as is.

## 2023-05-01 NOTE — Discharge Summary (Signed)
Aorta Discharge Summary    Jane Miller 11/28/1955 67 y.o. female  [id number]  Admission Date: 04/21/2023  Discharge Date: 04/27/2023  Physician: Lemar Livings, MD  Admission Diagnosis: S/P aortobifemoral bypass surgery [Z95.828] Aortic occlusion [facility]) [I70.0]  Discharge Day Diagnosis: S/P aortobifemoral bypass surgery [Z95.828] Aortic occlusion [facility]) [I70.0]    Hospital Course:  The patient was admitted to the hospital and taken to the operating room on 04/21/2023 and underwent: 1) aortobifemoral bypass with 14 x 7mm dacryon, 2) bilateral common femoral endarterectomies, 3) ligation of inferior mesenteric vein and artery by Dr. Randie Heinz. EBL during surgery was 1000 cc.  She was given 4 units PRBCs intraoperatively.  She was extubated postoperatively in the OR.  She tolerated the procedure well and was transported to the PACU in good condition.  She was then transferred to the ICU for postoperative management.  POD1- She had a creatinine bump to 1.7 with good urine output at 595 cc over 24 hours.  Her Foley catheter was kept in.  Postoperative hemoglobin was stable at 12.5.  Hemodynamically stable on 3 mcg of dopamine.  NG tube was kept in with low suction.  Patient did not pass flatus yet.  POD2-creatinine slowly improved but slightly lower urine output at 470 cc over 24 hours.  Her Foley catheter was kept in.  Postoperative hemoglobin at 9.0, likely deluded due to IVF.  Dopamine was stopped overnight.  She passed flatus and her NG tube was removed.  She was started on a liquid diet.  POD3-creatinine continuing to improve, with greatly improved urine output greater than 1400 cc over 24 hours.  Hemoglobin decreased to 7.7 with no obvious source of bleeding.  She was transfused 1 unit PRBCs.  She was given Lasix for gentle diuresis.  She was mobilizing more and experiencing some neurogenic medial knee pain.  This was controlled with gabapentin and Voltaren gel.  She tolerated a  liquid diet and was transitioned to a regular diet.  She had a BM.  POD4-creatinine was at 1.28 and Foley was discontinued.  Hemoglobin was stable at 9.0.  Her abdominal and bilateral groin incisions were healing appropriately with no signs of bleeding or infection.  She continued to mobilize with PT/OT with some knee pain.  She was tolerating a normal diet without nausea or vomiting.  POD5-creatinine was back to baseline.  Her hemoglobin was stable.  She continued incentive spirometry and was weaning off 2 L nasal cannula.  She was tolerating a normal diet still.  POD6-all of her incisions were well-appearing without signs of infection or bleeding.  She had palpable DP pulses.  Oxygenating well on room air after being weaned off nasal cannula.  She was mobilizing well and PT/OT recommended home health PT/OT.  She was discharged home.   CBC    Component Value Date/Time   WBC 8.3 04/27/2023 0309   RBC 3.05 (L) 04/27/2023 0309   HGB 9.4 (L) 04/27/2023 0309   HGB 13.2 11/26/2022 0417   HCT 29.2 (L) 04/27/2023 0309   HCT 38.4 11/26/2022 0417   PLT 138 (L) 04/27/2023 0309   PLT 247 11/26/2022 0417   MCV 95.7 04/27/2023 0309   MCV 95 11/26/2022 0417   MCH 30.8 04/27/2023 0309   MCHC 32.2 04/27/2023 0309   RDW 16.3 (H) 04/27/2023 0309   RDW 13.3 11/26/2022 0417   LYMPHSABS 1.4 11/26/2022 0417   EOSABS 0.1 11/26/2022 0417   BASOSABS 0.1 11/26/2022 0417    BMET    Component  Value Date/Time   NA 139 04/27/2023 0309   NA 143 11/26/2022 0417   K 4.3 04/27/2023 0309   CL 103 04/27/2023 0309   CO2 27 04/27/2023 0309   GLUCOSE 97 04/27/2023 0309   BUN 37 (H) 04/27/2023 0309   BUN 14 11/26/2022 0417   CREATININE 1.45 (H) 04/27/2023 0309   CREATININE 0.82 02/23/2013 1745   CALCIUM 8.1 (L) 04/27/2023 0309   GFRNONAA 40 (L) 04/27/2023 0309   GFRNONAA 80 02/23/2013 1745   GFRAA >89 02/23/2013 1745     Discharge Instructions     Call MD for:  redness, tenderness, or signs of infection  (pain, swelling, redness, odor or green/yellow discharge around incision site)   Complete by: As directed    Call MD for:  severe uncontrolled pain   Complete by: As directed    Call MD for:  temperature >100.4   Complete by: As directed    Diet - low sodium heart healthy   Complete by: As directed    Discharge wound care:   Complete by: As directed    Wash your incisions daily with antibacterial soap and water, then pat dry. Keep a dry gauze in your groins to prevent incision breakdown. You can shower   Increase activity slowly   Complete by: As directed        Discharge Diagnosis:  S/P aortobifemoral bypass surgery [Z95.828] Aortic occlusion (HCC) [I70.0]  Secondary Diagnosis: Patient Active Problem List   Diagnosis Date Noted   S/P aortobifemoral bypass surgery 04/21/2023   Aortic occlusion (HCC) 04/21/2023   CKD (chronic kidney disease) stage 3, GFR 30-59 ml/min (HCC) 12/18/2022   Nocturnal leg cramps 11/26/2022   Leg pain, bilateral 11/26/2022   Tobacco abuse 11/26/2022   History of pneumonia 11/26/2022   Smoking 1/2 pack a day or less 02/23/2013   Routine general medical examination at a health care facility 02/23/2013   Past Medical History:  Diagnosis Date   AAA (abdominal aortic aneurysm) (HCC)    Atherosclerosis of native artery of both lower extremities (HCC)    Hyperlipidemia    Leg pain    Nocturnal leg cramps      Allergies as of 04/27/2023       Reactions   Codeine Nausea And Vomiting   After 1-2 doses   Prednisone Nausea And Vomiting        Medication List     TAKE these medications    aspirin EC 81 MG tablet Take 81 mg by mouth daily. Swallow whole.   Calcium-Magnesium-Zinc-D3 Tabs Take 1 tablet by mouth daily.   Cholecalciferol 50 MCG (2000 UT) Tabs Take 2,000 Units by mouth daily.   COLLAGEN PO Take 3 Scoops by mouth daily.   diclofenac Sodium 1 % Gel Commonly known as: VOLTAREN Apply 2 g topically 4 (four) times daily -   before meals and at bedtime.   gabapentin 300 MG capsule Commonly known as: NEURONTIN Take 1 capsule (300 mg total) by mouth 3 (three) times daily.   multivitamin capsule Take 1 capsule by mouth daily.   rosuvastatin 10 MG tablet Commonly known as: CRESTOR Take 1 tablet (10 mg total) by mouth daily.               Discharge Care Instructions  (From admission, onward)           Start     Ordered   04/27/23 0000  Discharge wound care:       Comments: Wash your  incisions daily with antibacterial soap and water, then pat dry. Keep a dry gauze in your groins to prevent incision breakdown. You can shower   04/27/23 1612            Instructions:  [facility] Discharge Instructions Open Aortic Surgery  Please refer to the following instructions for your post-procedure care. Your surgeon or Physician Assistant will discuss any changes with you.  Activity  Avoid lifting more than eight pounds (a gallon of milk) until after your first post-operative visit. You are encouraged to walk as much as you can. You can slowly return to normal activities but must avoid strenuous activity and heavy lifting until your doctor tells you it's OK. Heavy lifting can hurt the incision and cause a hernia. Avoid activities such as vacuuming or swinging a golf club. It is normal to feel tired for several weeks after your surgery. Do not drive until your doctor gives the OK and you are no longer taking prescription pain medications. It is also normal to have difficulty with sleep habits, eating and bowl movements after surgery. These will go away with time.  Bathing/Showering  You may shower after you go home. Do not soak in a bathtub, hot tub, or swim until the incision heals.  Incision Care  Shower every day. Clean your incision with mild soap and water. Pat the area dry with a clean towel. You do not need a bandage unless otherwise instructed. Do not apply any  ointments or creams to your incision. You may have skin glue on your incision. Do not peel it off. It will come off on its own in about one week. If you have staples or sutures along your incision, they will be removed at your post op appointment.  If you have groin incisions, wash the groin wounds with soap and water daily and pat dry. (No tub bath-only shower)  Then put a dry gauze or washcloth in the groin to keep this area dry to help prevent wound infection.  Do this daily and as needed.  Do not use Vaseline or neosporin on your incisions.  Only use soap and water on your incisions and then protect and keep dry.  Diet  Resume your normal diet. There are no special food restriction following this procedure. A low fat/low cholesterol diet is recommended for all patients with vascular disease. After your aortic surgery, it's normal to feel full faster than usual and to not feel as hungry as you normally would. You will probably lose weight initially following your surgery. It's best to eat small, frequent meals over the course of the day. Call the office if you find that you are unable to eat even small meals. In order to heal from your surgery, it is CRITICAL to get adequate nutrition. Your body requires vitamins, minerals, and protein. Vegetables are the best source of vitamins and minerals. causing pain, you may take over-the-counter pain reliever such as acetaminophen (Tylenol). If you were prescribed a stronger pain medication, please be aware these medication can cause nausea and constipation. Prevent nausea by taking the medication with a snack or meal. Avoid constipation by drinking plenty of fluids and eating foods with a high amount of fiber, such as fruits, vegetables and grains. Take 100mg  of the over-the-counter stool softener Colace twice a day as needed to help with constipation. A laxative, such as Milk of Magnesia, may be recommended for you at this time. Do not take a laxative unless  your  surgeon or Physician Assistant. tells you it's OK. Do not take Tylenol if you are taking stronger pain medications (such as Percocet).  Follow Up  Our office will schedule a follow up appointment 2-3 weeks after discharge.  Please call us immediately for any of the following conditions   - Severe or worsening pain in your legs or feet or in your abdomen back or chest. - Increased pain, redness drainage (pus) from your incision site - Increased abdominal pain, bloating, nausea, vomiting, or persistent diarrhea. - Fever of 101 degrees or higher. - Swelling in your leg (s).  Reduce your risk of vascular disease  Stop smoking. If you would like help, call [facility] at 1-800-QUIT-NOW (9123855098) or [place] at 343-065-2737. Manage your cholesterol Maintain a desired weight Control your diabetes Keep your blood pressure down  If you have any questions please call the office at [phone].  Disposition: Home  Patient's condition: is Good  Follow up: 1. [facility] in 2 weeks for incision check   Loel Dubonnet, PA-C [facility] [phone] 05/01/2023  1:35 PM   - For VQI Registry use -  Post-op:  Time to Extubation: [x]  In OR, [ ]  < 12 hrs, [ ]  12-24 hrs, [ ]  >=24 hrs Vasopressors Req. Post-op: Yes ICU Stay: 3 days Transfusion: Yes   If yes, 1 units given MI: No, [ ]  Troponin only, [ ]  EKG or Clinical New Arrhythmia: No  Complications: CHF: No Resp failure: No, [ ]  Pneumonia, [ ]  Ventilator Chg in renal function: No, [ ]  Inc. Cr > 0.5, [ ]  Temp. Dialysis, [ ]  Permanent dialysis Leg ischemia: No, no Surgery needed, [ ]  Yes, Surgery needed, [ ]  Amputation Bowel ischemia: No, [ ]  Medical Rx, [ ]  Surgical Rx Wound complication: No, [ ]  Superficial separation/infection, [ ]  Return to OR Return to OR: No  Return to OR for bleeding: No Stroke: No, [ ]  Minor, [ ]  Major  Discharge medications: Statin use:  Yes If No:   ASA use:  Yes  If No:   Plavix  use:  No  Beta blocker use:  No  ACEI use:  No ARB use:  No CCB use:  No Coumadin use:  No

## 2023-05-06 ENCOUNTER — Encounter: Payer: Self-pay | Admitting: Internal Medicine

## 2023-05-06 ENCOUNTER — Ambulatory Visit: Payer: 59 | Admitting: Internal Medicine

## 2023-05-06 VITALS — BP 130/64 | HR 83 | Temp 98.9°F | Resp 18 | Ht 59.0 in | Wt 149.0 lb

## 2023-05-06 DIAGNOSIS — I714 Abdominal aortic aneurysm, without rupture, unspecified: Secondary | ICD-10-CM

## 2023-05-06 DIAGNOSIS — Z95828 Presence of other vascular implants and grafts: Secondary | ICD-10-CM

## 2023-05-06 DIAGNOSIS — B370 Candidal stomatitis: Secondary | ICD-10-CM | POA: Insufficient documentation

## 2023-05-06 DIAGNOSIS — N1831 Chronic kidney disease, stage 3a: Secondary | ICD-10-CM

## 2023-05-06 DIAGNOSIS — G4762 Sleep related leg cramps: Secondary | ICD-10-CM

## 2023-05-06 MED ORDER — FLUCONAZOLE 150 MG PO TABS: 150.0000 mg | ORAL_TABLET | Freq: Every day | ORAL | 0 refills | Status: DC

## 2023-05-06 NOTE — Progress Notes (Unsigned)
   Office Visit  Subjective   Patient ID: DENETTE PRESLAR   DOB: [birth year]   Age: 67 y.o.   MRN: [id number]   Chief Complaint Chief Complaint  Patient presents with   Follow-up    Hospital follow up      History of Present Illness HPI   Past Medical History Past Medical History:  Diagnosis Date   AAA (abdominal aortic aneurysm) (HCC)    Atherosclerosis of native artery of both lower extremities (HCC)    Hyperlipidemia    Leg pain    Nocturnal leg cramps      Allergies Allergies  Allergen Reactions   Codeine Nausea And Vomiting    After 1-2 doses   Prednisone Nausea And Vomiting     Review of Systems ROS     Objective:    Vitals BP 130/64 (BP Location: Left Arm, Patient Position: Sitting, Cuff Size: Normal)   Pulse 83   Temp 98.9 F (37.2 C)   Resp 18   Ht 4\' 11"  (1.499 m)   Wt 149 lb (67.6 kg)   SpO2 90%   BMI 30.09 kg/m    Physical Examination Physical Exam     Assessment & Plan:   No problem-specific Assessment & Plan notes found for this encounter.    Return in about 3 months (around 08/06/2023).   Eloisa Northern, MD

## 2023-05-07 DIAGNOSIS — I714 Abdominal aortic aneurysm, without rupture, unspecified: Secondary | ICD-10-CM | POA: Insufficient documentation

## 2023-05-07 NOTE — Assessment & Plan Note (Signed)
I will give fluconazole 150 mg daily for 5 days and stop.

## 2023-05-07 NOTE — Assessment & Plan Note (Signed)
She will continue with gabapentin.

## 2023-05-07 NOTE — Assessment & Plan Note (Signed)
She will follow-up with vascular surgery next week.

## 2023-05-07 NOTE — Assessment & Plan Note (Signed)
Continue to follow with vascular surgery

## 2023-05-12 NOTE — Progress Notes (Unsigned)
  POST OPERATIVE OFFICE NOTE    CC:  F/u for surgery  HPI:  This is a 67 y.o. female who is s/p *** on *** by Dr. Marland Kitchen    Pt returns today for follow up.  Pt states ***   Allergies  Allergen Reactions   Codeine Nausea And Vomiting    After 1-2 doses   Prednisone Nausea And Vomiting    Current Outpatient Medications  Medication Sig Dispense Refill   aspirin EC 81 MG tablet Take 81 mg by mouth daily. Swallow whole.     Cholecalciferol 50 MCG (2000 UT) TABS Take 2,000 Units by mouth daily.     COLLAGEN PO Take 3 Scoops by mouth daily.     diclofenac Sodium (VOLTAREN) 1 % GEL Apply 2 g topically 4 (four) times daily -  before meals and at bedtime. 100 g 0   fluconazole (DIFLUCAN) 150 MG tablet Take 1 tablet (150 mg total) by mouth daily. 5 tablet 0   Multiple Minerals-Vitamins (CALCIUM-MAGNESIUM-ZINC-D3) TABS Take 1 tablet by mouth daily.     Multiple Vitamin (MULTIVITAMIN) capsule Take 1 capsule by mouth daily.     rosuvastatin (CRESTOR) 10 MG tablet Take 1 tablet (10 mg total) by mouth daily. 90 tablet 3   No current facility-administered medications for this visit.     ROS:  See HPI  Physical Exam:  ***  Incision:  *** Extremities:  *** Neuro: *** Abdomen:  ***    Assessment/Plan:  This is a 67 y.o. female who is s/p: ***  -***   Loel Dubonnet, PA-C [facility] [phone]   Clinic MD:  ***

## 2023-05-14 ENCOUNTER — Ambulatory Visit (INDEPENDENT_AMBULATORY_CARE_PROVIDER_SITE_OTHER): Payer: 59 | Admitting: Physician Assistant

## 2023-05-14 VITALS — BP 115/75 | HR 78 | Temp 97.3°F | Wt 142.0 lb

## 2023-05-14 DIAGNOSIS — Z95828 Presence of other vascular implants and grafts: Secondary | ICD-10-CM

## 2023-05-14 DIAGNOSIS — I739 Peripheral vascular disease, unspecified: Secondary | ICD-10-CM

## 2023-05-15 ENCOUNTER — Other Ambulatory Visit: Payer: Self-pay

## 2023-05-15 DIAGNOSIS — I739 Peripheral vascular disease, unspecified: Secondary | ICD-10-CM

## 2023-05-27 DIAGNOSIS — B370 Candidal stomatitis: Secondary | ICD-10-CM

## 2023-05-27 DIAGNOSIS — G4762 Sleep related leg cramps: Secondary | ICD-10-CM | POA: Diagnosis not present

## 2023-05-27 DIAGNOSIS — N1831 Chronic kidney disease, stage 3a: Secondary | ICD-10-CM

## 2023-05-27 DIAGNOSIS — I714 Abdominal aortic aneurysm, without rupture, unspecified: Secondary | ICD-10-CM | POA: Diagnosis not present

## 2023-05-27 DIAGNOSIS — Z95828 Presence of other vascular implants and grafts: Secondary | ICD-10-CM | POA: Diagnosis not present

## 2023-06-04 ENCOUNTER — Ambulatory Visit: Payer: 59 | Attending: Interventional Cardiology

## 2023-06-04 DIAGNOSIS — E785 Hyperlipidemia, unspecified: Secondary | ICD-10-CM

## 2023-06-04 LAB — LIPID PANEL
Chol/HDL Ratio: 3.7 ratio (ref 0.0–4.4)
Cholesterol, Total: 141 mg/dL (ref 100–199)
HDL: 38 mg/dL — ABNORMAL LOW (ref >39)
LDL Chol Calc (NIH): 78 mg/dL (ref 0–99)
Triglycerides: 140 mg/dL (ref 0–149)
VLDL Cholesterol Cal: 25 mg/dL (ref 5–40)

## 2023-06-04 LAB — HEPATIC FUNCTION PANEL
ALT: 12 IU/L (ref 0–32)
AST: 19 IU/L (ref 0–40)
Albumin: 3.7 g/dL — ABNORMAL LOW (ref 3.9–4.9)
Alkaline Phosphatase: 72 IU/L (ref 44–121)
Bilirubin Total: <0.2 mg/dL (ref 0.0–1.2)
Bilirubin, Direct: <0.1 mg/dL (ref 0.00–0.40)
Total Protein: 6.7 g/dL (ref 6.0–8.5)

## 2023-06-05 ENCOUNTER — Other Ambulatory Visit: Payer: 59

## 2023-06-09 ENCOUNTER — Telehealth: Payer: Self-pay | Admitting: *Deleted

## 2023-06-09 DIAGNOSIS — E785 Hyperlipidemia, unspecified: Secondary | ICD-10-CM

## 2023-06-09 MED ORDER — ROSUVASTATIN CALCIUM 20 MG PO TABS: 20.0000 mg | ORAL_TABLET | Freq: Every day | ORAL | 3 refills | Status: DC

## 2023-06-09 NOTE — Telephone Encounter (Signed)
Patient notified.  Prescription sent to [facility].  Plan based on stress test results was for patient to follow up with PCP. Patient is changing PCP soon.  She will have fasting lab work done in our office on 12/10.

## 2023-06-09 NOTE — Telephone Encounter (Signed)
-----   Message from [place] sent at 06/04/2023  8:24 PM EDT ----- Would like to see LDL <70. Increase rosuvastatin to 20 mg daily and check liver and lipids in 3 months

## 2023-07-01 ENCOUNTER — Ambulatory Visit (INDEPENDENT_AMBULATORY_CARE_PROVIDER_SITE_OTHER): Payer: 59 | Admitting: Physician Assistant

## 2023-07-01 ENCOUNTER — Ambulatory Visit (HOSPITAL_COMMUNITY)
Admission: RE | Admit: 2023-07-01 | Discharge: 2023-07-01 | Disposition: A | Discharge status: Home / Self Care | Payer: 59 | Source: Ambulatory Visit | Attending: Vascular Surgery | Admitting: Vascular Surgery

## 2023-07-01 VITALS — BP 109/69 | HR 64 | Temp 98.4°F | Resp 18 | Ht 58.75 in | Wt 139.6 lb

## 2023-07-01 DIAGNOSIS — I739 Peripheral vascular disease, unspecified: Secondary | ICD-10-CM | POA: Insufficient documentation

## 2023-07-01 DIAGNOSIS — Z95828 Presence of other vascular implants and grafts: Secondary | ICD-10-CM

## 2023-07-01 LAB — VAS US ABI WITH/WO TBI
Left ABI: 1.26
Right ABI: 1.05

## 2023-07-02 ENCOUNTER — Encounter: Payer: Self-pay | Admitting: Physician Assistant
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
## 2023-07-28 ENCOUNTER — Other Ambulatory Visit: Payer: Self-pay

## 2023-07-28 DIAGNOSIS — I739 Peripheral vascular disease, unspecified: Secondary | ICD-10-CM

## 2023-09-07 ENCOUNTER — Encounter: Payer: Self-pay | Admitting: *Deleted

## 2023-09-08 ENCOUNTER — Other Ambulatory Visit: Payer: 59

## 2023-09-08 DIAGNOSIS — E785 Hyperlipidemia, unspecified: Secondary | ICD-10-CM

## 2023-09-08 LAB — HEPATIC FUNCTION PANEL
ALT: 17 [IU]/L (ref 0–32)
AST: 24 [IU]/L (ref 0–40)
Albumin: 4 g/dL (ref 3.9–4.9)
Alkaline Phosphatase: 96 [IU]/L (ref 44–121)
Bilirubin Total: 0.3 mg/dL (ref 0.0–1.2)
Bilirubin, Direct: 0.12 mg/dL (ref 0.00–0.40)
Total Protein: 6.8 g/dL (ref 6.0–8.5)

## 2023-09-08 LAB — LIPID PANEL
Chol/HDL Ratio: 3.2 {ratio} (ref 0.0–4.4)
Cholesterol, Total: 121 mg/dL (ref 100–199)
HDL: 38 mg/dL — ABNORMAL LOW (ref >39)
LDL Chol Calc (NIH): 68 mg/dL (ref 0–99)
Triglycerides: 75 mg/dL (ref 0–149)
VLDL Cholesterol Cal: 15 mg/dL (ref 5–40)

## 2023-10-12 ENCOUNTER — Telehealth: Payer: Self-pay | Admitting: Cardiology

## 2023-10-12 NOTE — Telephone Encounter (Signed)
Patient called to follow-up on test results. 

## 2023-10-12 NOTE — Telephone Encounter (Signed)
 Spoke with Pt gave results. Pt stated she has a new PCP Bernardino Pinal at [facility]. We also spoke about replacement cardiologist since Dr Dann has left. Pt is ok with any cardiologist at our office going forward. Paper copies of lab work were sent to [facility] in [place] after calling and verifying fax number.

## 2023-10-12 NOTE — Telephone Encounter (Signed)
 Dr. Elberta Fortis has never seen  this patient.  Not sure who this needs to go to.  Forwarding back to triage team

## 2023-11-04 ENCOUNTER — Telehealth: Payer: Self-pay
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
## 2023-11-05 ENCOUNTER — Ambulatory Visit (INDEPENDENT_AMBULATORY_CARE_PROVIDER_SITE_OTHER): Payer: 59 | Admitting: Physician Assistant

## 2023-11-05 VITALS — BP 127/60 | HR 63 | Temp 98.3°F | Ht 59.0 in | Wt 141.1 lb

## 2023-11-05 DIAGNOSIS — Z95828 Presence of other vascular implants and grafts: Secondary | ICD-10-CM | POA: Diagnosis not present

## 2023-11-05 DIAGNOSIS — I739 Peripheral vascular disease, unspecified: Secondary | ICD-10-CM | POA: Diagnosis not present

## 2023-11-05 NOTE — Progress Notes (Signed)
 Office Note   History of Present Illness   Jane Miller is a 68 y.o. (1955-11-16) female who presents as a triage visit.  She has a history of aortobifemoral bypass, bilateral common femoral endarterectomies, and ligation of the inferior mesenteric vein and arteries on 04/21/2023 by Dr. Sheree.  At her last follow-up with our office she was doing great.  She had no claudication, rest pain, or tissue loss.  She returns today as a triage visit.  She says since Thanksgiving she has been dealing with a bulge in her right upper abdomen.  She feels like this popped up after having to lift a 30 pound desk in October.  She states the bulge is usually flat in the mornings.  It will enlarge throughout the day and sometimes cause stabbing sensations.  It will also acutely enlarged and then decrease in size upon coughing or sitting up.  She denies any skin changes.  She denies any changes to her bowel movements.  Aside from this issue, she is doing well.  She is continuing to walk regularly.  She has no rest pain or tissue loss.  Current Outpatient Medications  Medication Sig Dispense Refill   aspirin  EC 81 MG tablet Take 81 mg by mouth daily. Swallow whole.     Cholecalciferol 50 MCG (2000 UT) TABS Take 2,000 Units by mouth daily.     COLLAGEN PO Take 3 Scoops by mouth daily.     diclofenac  Sodium (VOLTAREN ) 1 % GEL Apply 2 g topically 4 (four) times daily -  before meals and at bedtime. (Patient not taking: Reported on 07/01/2023) 100 g 0   Multiple Minerals-Vitamins (CALCIUM -MAGNESIUM -ZINC-D3) TABS Take 1 tablet by mouth daily.     Multiple Vitamin (MULTIVITAMIN) capsule Take 1 capsule by mouth daily.     rosuvastatin  (CRESTOR ) 20 MG tablet Take 1 tablet (20 mg total) by mouth daily. 90 tablet 3   No current facility-administered medications for this visit.    REVIEW OF SYSTEMS (negative unless checked):   Cardiac:  []  Chest pain or chest pressure? []  Shortness of breath upon  activity? []  Shortness of breath when lying flat? []  Irregular heart rhythm?  Vascular:  []  Pain in calf, thigh, or hip brought on by walking? []  Pain in feet at night that wakes you up from your sleep? []  Blood clot in your veins? []  Leg swelling?  Pulmonary:  []  Oxygen at home? []  Productive cough? []  Wheezing?  Neurologic:  []  Sudden weakness in arms or legs? []  Sudden numbness in arms or legs? []  Sudden onset of difficult speaking or slurred speech? []  Temporary loss of vision in one eye? []  Problems with dizziness?  Gastrointestinal:  []  Blood in stool? []  Vomited blood?  Genitourinary:  []  Burning when urinating? []  Blood in urine?  Psychiatric:  []  Major depression  Hematologic:  []  Bleeding problems? []  Problems with blood clotting?  Dermatologic:  []  Rashes or ulcers?  Constitutional:  []  Fever or chills?  Ear/Nose/Throat:  []  Change in hearing? []  Nose bleeds? []  Sore throat?  Musculoskeletal:  []  Back pain? []  Joint pain? []  Muscle pain?   Physical Examination   Vitals:   11/05/23 1001  BP: 127/60  Pulse: 63  Temp: 98.3 F (36.8 C)  SpO2: 95%  Weight: 141 lb 1.6 oz (64 kg)  Height: 4' 11 (1.499 m)   Body mass index is 28.5 kg/m.  General:  WDWN in NAD; vital signs documented above Gait: Not observed HENT:  WNL, normocephalic Pulmonary: normal non-labored breathing  Cardiac: regular Abdomen: soft, NT, ND.  When bearing down or sitting up, there is a soft, bulging area of tissue involving the right upper abdomen about 2-3cm in diameter. No overlying skin changes Skin: without rashes Vascular Exam/Pulses: palpable DP pulses bilaterally Extremities: without ischemic changes, without gangrene , without cellulitis; without open wounds;  Musculoskeletal: no muscle wasting or atrophy  Neurologic: A&O X 3;  No focal weakness or paresthesias are detected Psychiatric:  The pt has Normal affect.   Medical Decision Making   Jane Miller is a 68 y.o. female who presents as a triage visit  The patient says she has been dealing with a bulging area of tissue in her right upper abdomen since Thanksgiving. This happened shortly after she had to lift something heavy She states an area in her right upper abdomen with occasionally bulge out throughout the day, especially after moving around all day. This will occasionally cause her pain. She denies any overlying skin changes, fever, chills, N/V/D.  On exam her abdomen is soft and nondistended. In the right upper abdomen, slightly lateral to her laparotomy incision, a small bulge of tissue can be appreciated when the patient bears down or moves from a lying to seated position. This area flattens out after movement. There are no overlying skin changes It appears the patient may have developed a hernia due to her heavy lifting after surgery. She is minimally symptomatic at this time. There is no evidence of threatened or ischemic tissue. I have explained to the patient if this area enlarges and becomes more bothersome with time, we can order a CT scan and refer her to general surgery for evaluation She will continue to monitor this area for now. She can follow up with our office in 6 months with ABIs   Ahmed Holster PA-C [facility] Office: [phone]  Clinic MD: [place]

## 2023-11-16 ENCOUNTER — Other Ambulatory Visit: Payer: Self-pay

## 2023-11-16 DIAGNOSIS — I739 Peripheral vascular disease, unspecified: Secondary | ICD-10-CM

## 2024-03-25 ENCOUNTER — Ambulatory Visit: Attending: Cardiology | Admitting: Cardiology

## 2024-05-11 ENCOUNTER — Ambulatory Visit (HOSPITAL_COMMUNITY)
Admission: RE | Admit: 2024-05-11 | Discharge: 2024-05-11 | Disposition: A | Discharge status: Home / Self Care | Payer: 59 | Source: Ambulatory Visit | Attending: Vascular Surgery | Admitting: Vascular Surgery

## 2024-05-11 ENCOUNTER — Ambulatory Visit (INDEPENDENT_AMBULATORY_CARE_PROVIDER_SITE_OTHER): Payer: 59 | Admitting: Physician Assistant

## 2024-05-11 VITALS — BP 135/70 | HR 62 | Temp 98.0°F | Wt 150.6 lb

## 2024-05-11 DIAGNOSIS — I739 Peripheral vascular disease, unspecified: Secondary | ICD-10-CM | POA: Diagnosis present

## 2024-05-11 DIAGNOSIS — Z95828 Presence of other vascular implants and grafts: Secondary | ICD-10-CM | POA: Diagnosis present

## 2024-05-11 LAB — VAS US ABI WITH/WO TBI
Left ABI: 1.2
Right ABI: 1.18

## 2024-05-11 NOTE — Progress Notes (Signed)
 Office Note     CC:  follow up Requesting Provider:  Caleen Dirks, MD  HPI: Jane Miller is a 68 y.o. (1955/11/07) female who presents for surveillance of PAD.  She underwent aortobifemoral bypass with bilateral common femoral artery endarterectomies by Dr. Sheree on 04/21/2023 due to bilateral lower extremity lifestyle-limiting claudication.  She denies any return of claudication symptoms.  She is also without rest pain or tissue loss of bilateral lower extremities.  She required ventral hernia repair with mesh with general surgery and April of this year.  She is on aspirin and statin daily.  She denies tobacco use.   Past Medical History:  Diagnosis Date   AAA (abdominal aortic aneurysm) (HCC)    Atherosclerosis of native artery of both lower extremities (HCC)    Hyperlipidemia    Leg pain    Nocturnal leg cramps     Past Surgical History:  Procedure Laterality Date   AORTA - BILATERAL FEMORAL ARTERY BYPASS GRAFT N/A 04/21/2023   Procedure: AORTOBIFEMORAL BYPASS GRAFT USING HEMASHIELD GOLD VASCULAR GRAFT;  Surgeon: Sheree Penne Bruckner, MD;  Location: [facility] OR;  Service: Vascular;  Laterality: N/A;   CESAREAN SECTION  1981   CESAREAN SECTION  1986   LEFT HEART CATHETERIZATION WITH CORONARY ANGIOGRAM N/A 02/20/2012   Procedure: LEFT HEART CATHETERIZATION WITH CORONARY ANGIOGRAM;  Surgeon: Oneil Parchment, MD;  Location: [facility] CATH LAB;  Service: Cardiovascular;  Laterality: N/A;   TONSILLECTOMY     As a child   TUBAL LIGATION  1986    Social History   Socioeconomic History   Marital status: Divorced    Spouse name: Not on file   Number of children: Not on file   Years of education: Not on file   Highest education level: Not on file  Occupational History   Not on file  Tobacco Use   Smoking status: Former    Current packs/day: 0.00    Types: Cigarettes    Quit date: 11/27/2022    Years since quitting: 1.4   Smokeless tobacco: Not on file  Vaping Use   Vaping status:  Never Used  Substance and Sexual Activity   Alcohol use: No   Drug use: No   Sexual activity: Yes    Birth control/protection: None  Other Topics Concern   Not on file  Social History Narrative   Not on file   Social Drivers of Health   Financial Resource Strain: Not on file  Food Insecurity: No Food Insecurity (04/22/2023)   Hunger Vital Sign    Worried About Running Out of Food in the Last Year: Never true    Ran Out of Food in the Last Year: Never true  Transportation Needs: No Transportation Needs (04/22/2023)   PRAPARE - Administrator, Civil Service (Medical): No    Lack of Transportation (Non-Medical): No  Physical Activity: Not on file  Stress: Not on file  Social Connections: Not on file  Intimate Partner Violence: Not on file    Family History  Problem Relation Age of Onset   COPD Mother    Heart attack Mother    Peripheral Artery Disease Father     Current Outpatient Medications  Medication Sig Dispense Refill   aspirin EC 81 MG tablet Take 81 mg by mouth daily. Swallow whole.     Cholecalciferol 50 MCG (2000 UT) TABS Take 2,000 Units by mouth daily.     COLLAGEN PO Take 3 Scoops by mouth daily.  diclofenac  Sodium (VOLTAREN ) 1 % GEL Apply 2 g topically 4 (four) times daily -  before meals and at bedtime. (Patient not taking: Reported on 07/01/2023) 100 g 0   Multiple Minerals-Vitamins (CALCIUM -MAGNESIUM -ZINC-D3) TABS Take 1 tablet by mouth daily.     Multiple Vitamin (MULTIVITAMIN) capsule Take 1 capsule by mouth daily.     omeprazole (PRILOSEC) 40 MG capsule Take 40 mg by mouth daily.     rosuvastatin  (CRESTOR ) 20 MG tablet Take 1 tablet (20 mg total) by mouth daily. 90 tablet 3   No current facility-administered medications for this visit.    Allergies  Allergen Reactions   Codeine Nausea And Vomiting    After 1-2 doses   Prednisone Nausea And Vomiting     REVIEW OF SYSTEMS:  Negative unless noted in HPI [X]  denotes positive finding,  [ ]  denotes negative finding Cardiac  Comments:  Chest pain or chest pressure:    Shortness of breath upon exertion:    Short of breath when lying flat:    Irregular heart rhythm:        Vascular    Pain in calf, thigh, or hip brought on by ambulation:    Pain in feet at night that wakes you up from your sleep:     Blood clot in your veins:    Leg swelling:         Pulmonary    Oxygen at home:    Productive cough:     Wheezing:         Neurologic    Sudden weakness in arms or legs:     Sudden numbness in arms or legs:     Sudden onset of difficulty speaking or slurred speech:    Temporary loss of vision in one eye:     Problems with dizziness:         Gastrointestinal    Blood in stool:     Vomited blood:         Genitourinary    Burning when urinating:     Blood in urine:        Psychiatric    Major depression:         Hematologic    Bleeding problems:    Problems with blood clotting too easily:        Skin    Rashes or ulcers:        Constitutional    Fever or chills:      PHYSICAL EXAMINATION:  Vitals:   05/11/24 1330  BP: 135/70  Pulse: 62  Temp: 98 F (36.7 C)  TempSrc: Temporal  Weight: 150 lb 9.6 oz (68.3 kg)    General:  WDWN in NAD; vital signs documented above Gait: Not observed HENT: WNL, normocephalic Pulmonary: normal non-labored breathing Cardiac: regular HR Abdomen: soft, NT, no masses Skin: without rashes Vascular Exam/Pulses: palpable DP and PT pulses BLE Extremities: without ischemic changes, without Gangrene , without cellulitis; without open wounds;  Musculoskeletal: no muscle wasting or atrophy  Neurologic: A&O X 3 Psychiatric:  The pt has Normal affect.   Non-Invasive Vascular Imaging:   ABI/TBIToday's ABIToday's TBIPrevious ABIPrevious TBI  +-------+-----------+-----------+------------+------------+  Right 1.18       0.90       1.05        0.97           +-------+-----------+-----------+------------+------------+  Left  1.20       0.85       1.26  0.91          +-------+-----------+-----------+------------+---------     ASSESSMENT/PLAN:: 68 y.o. female with history of aortobifemoral bypass due to bilateral lower extremity lifestyle-limiting claudication  Subjectively, she has not experienced any claudication symptoms since surgery.  Bilateral lower extremities well-perfused with palpable DP and PT pulses.  ABIs and TBI's are stable compared to last office visit 6 months ago.  She will continue her aspirin  and statin daily.  I encouraged her to continue to walk for exercise.  We will repeat ABIs in 1 year.   Donnice Sender, PA-C [facility] [phone]  Clinic MD:   Sheree

## 2024-05-11 NOTE — Progress Notes (Signed)
 Office Note     CC:  follow up Requesting Provider:  Caleen Dirks, MD  HPI: Jane Miller is a 68 y.o. (09-Apr-1956) female who presents for surveillance of PAD.  She underwent aortobifemoral bypass with bilateral common femoral artery endarterectomies by Dr. Sheree on 04/21/2023 due to bilateral lower extremity lifestyle-limiting claudication.  She denies any return of claudication symptoms.  She is also without rest pain or tissue loss of bilateral lower extremities.  She required ventral hernia repair with mesh with general surgery and April of this year.  She is on aspirin  and statin daily.  She denies tobacco use.   Past Medical History:  Diagnosis Date   AAA (abdominal aortic aneurysm) (HCC)    Atherosclerosis of native artery of both lower extremities (HCC)    Hyperlipidemia    Leg pain    Nocturnal leg cramps     Past Surgical History:  Procedure Laterality Date   AORTA - BILATERAL FEMORAL ARTERY BYPASS GRAFT N/A 04/21/2023   Procedure: AORTOBIFEMORAL BYPASS GRAFT USING HEMASHIELD GOLD VASCULAR GRAFT;  Surgeon: Sheree Penne Bruckner, MD;  Location: [facility] OR;  Service: Vascular;  Laterality: N/A;   CESAREAN SECTION  1981   CESAREAN SECTION  1986   LEFT HEART CATHETERIZATION WITH CORONARY ANGIOGRAM N/A 02/20/2012   Procedure: LEFT HEART CATHETERIZATION WITH CORONARY ANGIOGRAM;  Surgeon: Oneil Parchment, MD;  Location: [facility] CATH LAB;  Service: Cardiovascular;  Laterality: N/A;   TONSILLECTOMY     As a child   TUBAL LIGATION  1986    Social History   Socioeconomic History   Marital status: Divorced    Spouse name: Not on file   Number of children: Not on file   Years of education: Not on file   Highest education level: Not on file  Occupational History   Not on file  Tobacco Use   Smoking status: Former    Current packs/day: 0.00    Types: Cigarettes    Quit date: 11/27/2022    Years since quitting: 1.4   Smokeless tobacco: Not on file  Vaping Use   Vaping status:  Never Used  Substance and Sexual Activity   Alcohol use: No   Drug use: No   Sexual activity: Yes    Birth control/protection: None  Other Topics Concern   Not on file  Social History Narrative   Not on file   Social Drivers of Health   Financial Resource Strain: Not on file  Food Insecurity: No Food Insecurity (04/22/2023)   Hunger Vital Sign    Worried About Running Out of Food in the Last Year: Never true    Ran Out of Food in the Last Year: Never true  Transportation Needs: No Transportation Needs (04/22/2023)   PRAPARE - Administrator, Civil Service (Medical): No    Lack of Transportation (Non-Medical): No  Physical Activity: Not on file  Stress: Not on file  Social Connections: Not on file  Intimate Partner Violence: Not on file    Family History  Problem Relation Age of Onset   COPD Mother    Heart attack Mother    Peripheral Artery Disease Father     Current Outpatient Medications  Medication Sig Dispense Refill   aspirin  EC 81 MG tablet Take 81 mg by mouth daily. Swallow whole.     Cholecalciferol 50 MCG (2000 UT) TABS Take 2,000 Units by mouth daily.     COLLAGEN PO Take 3 Scoops by mouth daily.  diclofenac  Sodium (VOLTAREN ) 1 % GEL Apply 2 g topically 4 (four) times daily -  before meals and at bedtime. (Patient not taking: Reported on 07/01/2023) 100 g 0   Multiple Minerals-Vitamins (CALCIUM -MAGNESIUM -ZINC-D3) TABS Take 1 tablet by mouth daily.     Multiple Vitamin (MULTIVITAMIN) capsule Take 1 capsule by mouth daily.     omeprazole (PRILOSEC) 40 MG capsule Take 40 mg by mouth daily.     rosuvastatin  (CRESTOR ) 20 MG tablet Take 1 tablet (20 mg total) by mouth daily. 90 tablet 3   No current facility-administered medications for this visit.    Allergies  Allergen Reactions   Codeine Nausea And Vomiting    After 1-2 doses   Prednisone Nausea And Vomiting     REVIEW OF SYSTEMS:  Negative unless noted in HPI [X]  denotes positive finding,  [ ]  denotes negative finding Cardiac  Comments:  Chest pain or chest pressure:    Shortness of breath upon exertion:    Short of breath when lying flat:    Irregular heart rhythm:        Vascular    Pain in calf, thigh, or hip brought on by ambulation:    Pain in feet at night that wakes you up from your sleep:     Blood clot in your veins:    Leg swelling:         Pulmonary    Oxygen at home:    Productive cough:     Wheezing:         Neurologic    Sudden weakness in arms or legs:     Sudden numbness in arms or legs:     Sudden onset of difficulty speaking or slurred speech:    Temporary loss of vision in one eye:     Problems with dizziness:         Gastrointestinal    Blood in stool:     Vomited blood:         Genitourinary    Burning when urinating:     Blood in urine:        Psychiatric    Major depression:         Hematologic    Bleeding problems:    Problems with blood clotting too easily:        Skin    Rashes or ulcers:        Constitutional    Fever or chills:      PHYSICAL EXAMINATION:  Vitals:   05/11/24 1330  BP: 135/70  Pulse: 62  Temp: 98 F (36.7 C)  TempSrc: Temporal  Weight: 150 lb 9.6 oz (68.3 kg)    General:  WDWN in NAD; vital signs documented above Gait: Not observed HENT: WNL, normocephalic Pulmonary: normal non-labored breathing Cardiac: regular HR Abdomen: soft, NT, no masses Skin: without rashes Vascular Exam/Pulses: palpable DP and PT pulses BLE Extremities: without ischemic changes, without Gangrene , without cellulitis; without open wounds;  Musculoskeletal: no muscle wasting or atrophy  Neurologic: A&O X 3 Psychiatric:  The pt has Normal affect.   Non-Invasive Vascular Imaging:   ABI/TBIToday's ABIToday's TBIPrevious ABIPrevious TBI  +-------+-----------+-----------+------------+------------+  Right 1.18       0.90       1.05        0.97           +-------+-----------+-----------+------------+------------+  Left  1.20       0.85       1.26  0.91          +-------+-----------+-----------+------------+---------     ASSESSMENT/PLAN:: 68 y.o. female with history of aortobifemoral bypass due to bilateral lower extremity lifestyle-limiting claudication  Subjectively, she has not experienced any claudication symptoms since surgery.  Bilateral lower extremities well-perfused with palpable DP and PT pulses.  ABIs and TBI's are stable compared to last office visit 6 months ago.  She will continue her aspirin  and statin daily.  I encouraged her to continue to walk for exercise.  We will repeat ABIs in 1 year.   Donnice Sender, PA-C [facility] [phone]  Clinic MD:   Sheree

## 2024-06-02 ENCOUNTER — Other Ambulatory Visit: Payer: Self-pay | Admitting: Interventional Cardiology

## 2024-07-04 ENCOUNTER — Telehealth: Payer: Self-pay | Admitting: Physician Assistant

## 2024-07-04 MED ORDER — ROSUVASTATIN CALCIUM 20 MG PO TABS: 20.0000 mg | ORAL_TABLET | Freq: Every day | ORAL | 0 refills | Status: DC

## 2024-07-04 NOTE — Telephone Encounter (Signed)
*  STAT* If patient is at the pharmacy, call can be transferred to refill team.   1. Which medications need to be refilled? (please list name of each medication and dose if known) rosuvastatin  (CRESTOR ) 20 MG tablet    2. Would you like to learn more about the convenience, safety, & potential cost savings by using the [facility]? No   3. Are you open to using the [facility] (Type [facility]. ) No   4. Which pharmacy/location (including street and city if local pharmacy) is medication to be sent to?  [facility] [address] [place], [place] - [address] AT [facility] [address]   5. Do they need a 30 day or 90 day supply? 30 day  Pt has scheduled appt on 10/23

## 2024-07-04 NOTE — Telephone Encounter (Signed)
 Pt's medication was sent to pt's pharmacy as requested. Confirmation received.

## 2024-07-19 ENCOUNTER — Encounter (HOSPITAL_BASED_OUTPATIENT_CLINIC_OR_DEPARTMENT_OTHER): Payer: Self-pay

## 2024-07-20 NOTE — Progress Notes (Unsigned)
 Cardiology Office Note   Date:  07/21/2024  ID:  Maralee, Higuchi [birth year], MRN [id number] PCP: Caleen Dirks, MD  [facility] Providers Cardiologist:  Candyce Reek, MD   History of Present Illness Jane Miller is a 68 y.o. female with a past medical history of PAD, DOE, former smoker, elevated BP, and some liver changes on her CT scan here for follow-up appointment.  She was last seen by Dr. Reek 6/24.  She was seen for preoperative cardiovascular exam.  She was needing clearance for her aortobifem bypass surgery.  It was thought that her chest discomfort was noncardiac and likely musculoskeletal due to the physical nature of her job.  Tobacco cessation was discussed.  She was able to meet 4 METS for clearance.  She did stop smoking 3 months prior.  Sometimes she did have shortness of breath with walking up stairs.  Today, she  presents today for follow-up of her cardiovascular health and medication management. She was referred by her vascular surgeon to ensure her heart is strong enough for aorta bypass surgery when she was here last.   She underwent an echocardiogram in July 2024 and a stress test last summer, both showing normal results. A heart sonogram during an emergency room visit in September also indicated no issues. She has difficulty refilling her cholesterol medication, Crestor , which was increased from 10 mg to 20 mg due to suboptimal cholesterol levels. She quit smoking a year and a half ago. A recent CT scan was performed to rule out COPD, correcting a previous incorrect record of COPD.  Stress test 02/2023 was negative with no ischemia.  Reports no shortness of breath nor dyspnea on exertion. Reports no chest pain, pressure, or tightness. No edema, orthopnea, PND. Reports no palpitations.   Discussed the use of AI scribe software for clinical note transcription with the patient, who gave verbal consent to proceed.  ROS: pertinent ROS in  HPI  Studies Reviewed  06/13/2024  The left ventricular systolic function is normal with LVEF of 58% (+/-5%) by Simpson's biplane.  The right ventricular cavity size is normal with normal RV systolic function.  The interatrial septum is normal in appearance with no evidence of atrial septal defect or patent foramen ovale by color Doppler and agitated saline study.  No significant valve stenosis or regurgitation.   Left Ventricle  The left ventricular cavity size is normal with normal wall thickness. The left ventricular systolic function is normal with LVEF of 58% (+/-5%) by Simpson's biplane. No segmental/regional wall motion abnormalities identified. LV diastolic function cannot be determined due to insufficient data.   Right Ventricle  The right ventricular cavity size is normal with normal RV systolic function. Right ventricular systolic pressure could not be measured secondary to minimal or no tricuspid regurgitation.   Left Atrium  The left atrium is mildly dilated by 2D indexed volume measurement.   Right Atrium  The right atrium is normal in size by qualitative assessment.   Tricuspid Valve  The tricuspid valve is normal in appearance with trace tricuspid regurgitation and no stenosis.   Mitral Valve  The mitral valve is normal in appearance with trace mitral regurgitation and no mitral stenosis.   Aortic Valve  The aortic valve leaflets are normal with no aortic stenosis and no aortic regurgitation.   Pulmonic Valve  The pulmonic valve is normal in appearance with no pulmonic regurgitation and no stenosis.   Interatrial Septum  The interatrial septum is normal in appearance  with no evidence of atrial septal defect or patent foramen ovale by color Doppler and agitated saline study.   IVC/Hepatic  Normal inferior vena cava size with >50% inspiratory collapse (estimated RA pressure: 3 mmHg).   Aorta  The aortic root diameter is normal <4.0 cm. The ascending aorta  diameter is normal <4.0 cm.   Pericardial/Pleural  There is no pericardial effusion.       Lexiscan  Myoview  03/11/2023    The ECG shows premature atrial contractions.   A pharmacological stress test was performed using IV Lexiscan  0.4mg  over 10 seconds performed without concurrent submaximal exercise. The patient reported nausea during the stress test. Normal blood pressure and normal heart rate response noted during stress. Heart rate recovery was normal.   No ST deviation was noted. ECG was interpretable and without significant changes. The ECG was not diagnostic due to pharmacologic protocol.   Breast attenuation artifact was present.   LV perfusion is normal. There is no evidence of ischemia. There is no evidence of infarction.   Left ventricular function is normal. Nuclear stress EF: 61 %. The left ventricular ejection fraction is normal (55-65%). End diastolic cavity size is normal. End systolic cavity size is normal.   The study is normal. The study is low risk.      Physical Exam VS:  BP 128/73   Pulse 61   Ht 4' 11 (1.499 m)   Wt 144 lb (65.3 kg)   SpO2 97%   BMI 29.08 kg/m        Wt Readings from Last 3 Encounters:  07/21/24 144 lb (65.3 kg)  05/11/24 150 lb 9.6 oz (68.3 kg)  11/05/23 141 lb 1.6 oz (64 kg)    GEN: Well nourished, well developed in no acute distress NECK: No JVD; No carotid bruits CARDIAC: RRR, no murmurs, rubs, gallops RESPIRATORY:  Clear to auscultation without rales, wheezing or rhonchi  ABDOMEN: Soft, non-tender, non-distended EXTREMITIES:  No edema; No deformity   ASSESSMENT AND PLAN  Hyperlipidemia Cholesterol levels controlled with increased statin dosage. LDL at 67, triglycerides at 66. - Send prescription for a year's supply of statin to [facility] pharmacy. - Ensure primary care physician can refill statin prescription.  PAD -Dr. Silver monitors     Dispo: She can follow-up PRN with our practice.   Signed, Orren LOISE Fabry, PA-C

## 2024-07-21 ENCOUNTER — Ambulatory Visit: Attending: Physician Assistant | Admitting: Physician Assistant

## 2024-07-21 VITALS — BP 128/73 | HR 61 | Ht 59.0 in | Wt 144.0 lb

## 2024-07-21 DIAGNOSIS — G4762 Sleep related leg cramps: Secondary | ICD-10-CM

## 2024-07-21 DIAGNOSIS — R0609 Other forms of dyspnea: Secondary | ICD-10-CM | POA: Diagnosis not present

## 2024-07-21 DIAGNOSIS — Z01818 Encounter for other preprocedural examination: Secondary | ICD-10-CM

## 2024-07-21 DIAGNOSIS — E785 Hyperlipidemia, unspecified: Secondary | ICD-10-CM

## 2024-07-21 DIAGNOSIS — I739 Peripheral vascular disease, unspecified: Secondary | ICD-10-CM | POA: Diagnosis not present

## 2024-07-21 DIAGNOSIS — M79604 Pain in right leg: Secondary | ICD-10-CM

## 2024-07-21 DIAGNOSIS — M79605 Pain in left leg: Secondary | ICD-10-CM

## 2024-07-21 MED ORDER — ROSUVASTATIN CALCIUM 20 MG PO TABS: 20.0000 mg | ORAL_TABLET | Freq: Every day | ORAL | 3 refills | Status: AC

## 2024-07-21 NOTE — Patient Instructions (Signed)
 Medication Instructions:  Your physician recommends that you continue on your current medications as directed. Please refer to the Current Medication list given to you today.  *If you need a refill on your cardiac medications before your next appointment, please call your pharmacy*  Lab Work: NONE   Testing/Procedures: NONE  Follow-Up: At [facility], you and your health needs are our priority.  As part of our continuing mission to provide you with exceptional heart care, our providers are all part of one team.  This team includes your primary Cardiologist (physician) and Advanced Practice Providers or APPs (Physician Assistants and Nurse Practitioners) who all work together to provide you with the care you need, when you need it.  Your next appointment:   AS NEEDED  Provider:   Any APP

## 2024-07-25 ENCOUNTER — Telehealth: Payer: Self-pay | Admitting: Physician Assistant

## 2024-07-25 NOTE — Telephone Encounter (Signed)
 I spoke with [facility] and they received refill that was sent in on 10/23. Patient can pick up on 10/29 if she wants to use insurance.  Patient notified.

## 2024-07-25 NOTE — Telephone Encounter (Signed)
 Pt c/o medication issue:  1. Name of Medication:   rosuvastatin  (CRESTOR ) 20 MG tablet    2. How are you currently taking this medication (dosage and times per day)? As written   3. Are you having a reaction (difficulty breathing--STAT)? no  4. What is your medication issue? Pt states she would like her Crestor  refilled for a year with refills please advise

## 2024-09-06 ENCOUNTER — Telehealth: Payer: Self-pay

## 2024-09-06 NOTE — Telephone Encounter (Signed)
 Pt called on 09/06/2024 c/o mouth numbness and leg numbness. After speaking with triage this PAA explained to pt that this office could address the complaints about her leg but would need to go back to oral surgery/ENT about her oral complaints. Pt wanted to know if she could see Dr. Sheree for a oral consult since the numbness in her mouth is a result of propofol  from her bypass. This PAA states she has had consults of her mouth by oral surgery, and pt became irate (Dr. Sheree heard pt yelling on phone from hallway) and began yelling at this PAA. Pt kept on about her mouth and how she has lost weight, food taste like cardboard, and couldn't swallow because of the taste, and wants to know if a nerve was nicked and that's why she's having nerve pain in her legs. AT,RN was near and heard what pt was saying, and began to speak with pt about her symptoms. After one hour of education about her surgery with Sheree, and reminding pt to return to her oral surgeon/ENT for oral concerns, pt was scheduled to see Dr. Sheree in January as pt was adamant about seeing the surgeon. Pt felt that it is necessary to get another ABI but was educated that [place] recommended this ultrasound. Pt was reluctant but agreed. MP gave pt address and directions on what floor to go to, and thus ended our conversation.

## 2024-09-15 ENCOUNTER — Other Ambulatory Visit: Payer: Self-pay | Admitting: *Deleted

## 2024-09-15 DIAGNOSIS — Z95828 Presence of other vascular implants and grafts: Secondary | ICD-10-CM

## 2024-09-15 DIAGNOSIS — I739 Peripheral vascular disease, unspecified: Secondary | ICD-10-CM

## 2024-09-15 DIAGNOSIS — I70213 Atherosclerosis of native arteries of extremities with intermittent claudication, bilateral legs: Secondary | ICD-10-CM

## 2024-10-04 ENCOUNTER — Ambulatory Visit (HOSPITAL_COMMUNITY): Attending: Internal Medicine

## 2024-10-05 ENCOUNTER — Ambulatory Visit: Admitting: Vascular Surgery
